# Patient Record
Sex: Female | Born: 1935 | Race: White | Hispanic: No | Smoking: Never smoker
Health system: Southern US, Community
[De-identification: ages and names within clinical notes are randomized; demographics above are authoritative.]

## PROBLEM LIST (undated history)

## (undated) DIAGNOSIS — S72009A Fracture of unspecified part of neck of unspecified femur, initial encounter for closed fracture: Secondary | ICD-10-CM

## (undated) DIAGNOSIS — Z9181 History of falling: Secondary | ICD-10-CM

## (undated) DIAGNOSIS — F32A Depression, unspecified: Secondary | ICD-10-CM

## (undated) DIAGNOSIS — C801 Malignant (primary) neoplasm, unspecified: Secondary | ICD-10-CM

## (undated) DIAGNOSIS — F411 Generalized anxiety disorder: Secondary | ICD-10-CM

## (undated) DIAGNOSIS — H3530 Unspecified macular degeneration: Secondary | ICD-10-CM

## (undated) DIAGNOSIS — K5900 Constipation, unspecified: Secondary | ICD-10-CM

## (undated) DIAGNOSIS — M1990 Unspecified osteoarthritis, unspecified site: Secondary | ICD-10-CM

## (undated) DIAGNOSIS — S72009D Fracture of unspecified part of neck of unspecified femur, subsequent encounter for closed fracture with routine healing: Secondary | ICD-10-CM

## (undated) DIAGNOSIS — R269 Unspecified abnormalities of gait and mobility: Secondary | ICD-10-CM

## (undated) DIAGNOSIS — Z7901 Long term (current) use of anticoagulants: Secondary | ICD-10-CM

## (undated) DIAGNOSIS — D649 Anemia, unspecified: Secondary | ICD-10-CM

## (undated) DIAGNOSIS — M810 Age-related osteoporosis without current pathological fracture: Secondary | ICD-10-CM

## (undated) DIAGNOSIS — I10 Essential (primary) hypertension: Secondary | ICD-10-CM

## (undated) DIAGNOSIS — I999 Unspecified disorder of circulatory system: Secondary | ICD-10-CM

## (undated) DIAGNOSIS — F0390 Unspecified dementia without behavioral disturbance: Secondary | ICD-10-CM

## (undated) DIAGNOSIS — F329 Major depressive disorder, single episode, unspecified: Secondary | ICD-10-CM

## (undated) DIAGNOSIS — N189 Chronic kidney disease, unspecified: Secondary | ICD-10-CM

## (undated) HISTORY — DX: History of falling: Z91.81

## (undated) HISTORY — DX: Generalized anxiety disorder: F41.1

## (undated) HISTORY — DX: Anemia, unspecified: D64.9

## (undated) HISTORY — PX: OTHER SURGICAL HISTORY: SHX169

## (undated) HISTORY — DX: Depression, unspecified: F32.A

## (undated) HISTORY — DX: Age-related osteoporosis without current pathological fracture: M81.0

## (undated) HISTORY — DX: Fracture of unspecified part of neck of unspecified femur, subsequent encounter for closed fracture with routine healing: S72.009D

## (undated) HISTORY — DX: Fracture of unspecified part of neck of unspecified femur, initial encounter for closed fracture: S72.009A

## (undated) HISTORY — DX: Unspecified abnormalities of gait and mobility: R26.9

## (undated) HISTORY — DX: Major depressive disorder, single episode, unspecified: F32.9

## (undated) HISTORY — DX: Unspecified disorder of circulatory system: I99.9

## (undated) HISTORY — DX: Long term (current) use of anticoagulants: Z79.01

## (undated) HISTORY — DX: Constipation, unspecified: K59.00

## (undated) HISTORY — PX: ABDOMINAL HYSTERECTOMY: SHX81

---

## 1998-06-07 ENCOUNTER — Ambulatory Visit (HOSPITAL_COMMUNITY): Admission: RE | Admit: 1998-06-07 | Discharge: 1998-06-07 | Payer: Self-pay | Admitting: Obstetrics and Gynecology

## 1998-06-08 ENCOUNTER — Other Ambulatory Visit: Admission: RE | Admit: 1998-06-08 | Discharge: 1998-06-08 | Payer: Self-pay | Admitting: Obstetrics and Gynecology

## 1998-06-16 ENCOUNTER — Ambulatory Visit (HOSPITAL_COMMUNITY): Admission: RE | Admit: 1998-06-16 | Discharge: 1998-06-16 | Payer: Self-pay | Admitting: Obstetrics and Gynecology

## 2000-03-17 ENCOUNTER — Encounter: Admission: RE | Admit: 2000-03-17 | Discharge: 2000-03-17 | Payer: Self-pay | Admitting: Internal Medicine

## 2000-03-17 ENCOUNTER — Encounter: Payer: Self-pay | Admitting: Internal Medicine

## 2002-07-26 ENCOUNTER — Other Ambulatory Visit: Admission: RE | Admit: 2002-07-26 | Discharge: 2002-07-26 | Payer: Self-pay | Admitting: Obstetrics and Gynecology

## 2002-08-02 ENCOUNTER — Encounter: Payer: Self-pay | Admitting: Internal Medicine

## 2002-08-02 ENCOUNTER — Encounter (INDEPENDENT_AMBULATORY_CARE_PROVIDER_SITE_OTHER): Payer: Self-pay | Admitting: *Deleted

## 2002-08-02 ENCOUNTER — Other Ambulatory Visit: Admission: RE | Admit: 2002-08-02 | Discharge: 2002-08-02 | Payer: Self-pay | Admitting: Diagnostic Radiology

## 2002-08-02 ENCOUNTER — Encounter: Admission: RE | Admit: 2002-08-02 | Discharge: 2002-08-02 | Payer: Self-pay | Admitting: Internal Medicine

## 2002-09-06 ENCOUNTER — Encounter: Payer: Self-pay | Admitting: Surgery

## 2002-09-07 ENCOUNTER — Encounter (INDEPENDENT_AMBULATORY_CARE_PROVIDER_SITE_OTHER): Payer: Self-pay | Admitting: *Deleted

## 2002-09-07 ENCOUNTER — Ambulatory Visit (HOSPITAL_COMMUNITY): Admission: RE | Admit: 2002-09-07 | Discharge: 2002-09-08 | Payer: Self-pay | Admitting: Surgery

## 2002-09-07 ENCOUNTER — Encounter: Payer: Self-pay | Admitting: Surgery

## 2002-10-06 ENCOUNTER — Ambulatory Visit: Admission: RE | Admit: 2002-10-06 | Discharge: 2002-12-28 | Payer: Self-pay | Admitting: Family Medicine

## 2002-10-19 ENCOUNTER — Encounter: Admission: RE | Admit: 2002-10-19 | Discharge: 2002-10-19 | Payer: Self-pay | Admitting: Radiation Oncology

## 2003-05-11 ENCOUNTER — Encounter: Admission: RE | Admit: 2003-05-11 | Discharge: 2003-05-11 | Payer: Self-pay | Admitting: Surgery

## 2003-05-11 ENCOUNTER — Encounter: Payer: Self-pay | Admitting: Surgery

## 2004-05-11 ENCOUNTER — Encounter: Admission: RE | Admit: 2004-05-11 | Discharge: 2004-05-11 | Payer: Self-pay | Admitting: Surgery

## 2004-08-27 ENCOUNTER — Ambulatory Visit: Payer: Self-pay | Admitting: Oncology

## 2004-12-21 ENCOUNTER — Ambulatory Visit: Payer: Self-pay | Admitting: Oncology

## 2004-12-26 ENCOUNTER — Encounter: Admission: RE | Admit: 2004-12-26 | Discharge: 2004-12-26 | Payer: Self-pay | Admitting: Oncology

## 2005-02-07 ENCOUNTER — Ambulatory Visit: Payer: Self-pay | Admitting: Oncology

## 2005-05-06 ENCOUNTER — Ambulatory Visit: Payer: Self-pay | Admitting: Oncology

## 2005-11-04 ENCOUNTER — Ambulatory Visit: Payer: Self-pay | Admitting: Oncology

## 2006-05-02 ENCOUNTER — Ambulatory Visit: Payer: Self-pay | Admitting: Oncology

## 2006-05-19 LAB — CBC WITH DIFFERENTIAL (CANCER CENTER ONLY)
EOS%: 2.3 % (ref 0.0–7.0)
LYMPH#: 0.9 10*3/uL (ref 0.9–3.3)
NEUT#: 2.4 10*3/uL (ref 1.5–6.5)
NEUT%: 67 % (ref 39.6–80.0)
Platelets: 200 10*3/uL (ref 145–400)
RDW: 12.3 % (ref 10.5–14.6)
WBC: 3.6 10*3/uL — ABNORMAL LOW (ref 3.9–10.0)

## 2006-05-19 LAB — COMPREHENSIVE METABOLIC PANEL
AST: 16 U/L (ref 0–37)
Albumin: 3.7 g/dL (ref 3.5–5.2)
Alkaline Phosphatase: 34 U/L — ABNORMAL LOW (ref 39–117)
Chloride: 105 mEq/L (ref 96–112)
Creatinine, Ser: 1.4 mg/dL — ABNORMAL HIGH (ref 0.40–1.20)
Potassium: 4.7 mEq/L (ref 3.5–5.3)
Total Bilirubin: 0.6 mg/dL (ref 0.3–1.2)
Total Protein: 6.1 g/dL (ref 6.0–8.3)

## 2006-05-27 ENCOUNTER — Encounter: Admission: RE | Admit: 2006-05-27 | Discharge: 2006-05-27 | Payer: Self-pay | Admitting: Oncology

## 2006-11-13 ENCOUNTER — Ambulatory Visit: Payer: Self-pay | Admitting: Oncology

## 2006-11-17 LAB — CBC WITH DIFFERENTIAL (CANCER CENTER ONLY)
BASO#: 0 10*3/uL (ref 0.0–0.2)
BASO%: 0.3 % (ref 0.0–2.0)
EOS%: 2 % (ref 0.0–7.0)
Eosinophils Absolute: 0.1 10*3/uL (ref 0.0–0.5)
LYMPH#: 1 10*3/uL (ref 0.9–3.3)
LYMPH%: 28.2 % (ref 14.0–48.0)
MCH: 31.4 pg (ref 26.0–34.0)
MCV: 90 fL (ref 81–101)
MONO#: 0.3 10*3/uL (ref 0.1–0.9)
NEUT#: 2.2 10*3/uL (ref 1.5–6.5)
Platelets: 191 10*3/uL (ref 145–400)
WBC: 3.5 10*3/uL — ABNORMAL LOW (ref 3.9–10.0)

## 2006-11-17 LAB — COMPREHENSIVE METABOLIC PANEL
AST: 15 U/L (ref 0–37)
Albumin: 3.8 g/dL (ref 3.5–5.2)
Calcium: 8.6 mg/dL (ref 8.4–10.5)
Chloride: 105 mEq/L (ref 96–112)
Glucose, Bld: 108 mg/dL — ABNORMAL HIGH (ref 70–99)
Potassium: 4.4 mEq/L (ref 3.5–5.3)
Sodium: 140 mEq/L (ref 135–145)

## 2006-11-17 LAB — CANCER ANTIGEN 27.29: CA 27.29: 33 U/mL (ref 0–39)

## 2007-05-15 ENCOUNTER — Ambulatory Visit: Payer: Self-pay | Admitting: Oncology

## 2007-05-18 LAB — LACTATE DEHYDROGENASE: LDH: 155 U/L (ref 94–250)

## 2007-05-18 LAB — COMPREHENSIVE METABOLIC PANEL
ALT: <8 U/L (ref 0–35)
Alkaline Phosphatase: 47 U/L (ref 39–117)
BUN: 16 mg/dL (ref 6–23)
Glucose, Bld: 114 mg/dL — ABNORMAL HIGH (ref 70–99)
Sodium: 139 mEq/L (ref 135–145)
Total Bilirubin: 0.5 mg/dL (ref 0.3–1.2)
Total Protein: 6.5 g/dL (ref 6.0–8.3)

## 2007-05-18 LAB — CBC WITH DIFFERENTIAL (CANCER CENTER ONLY)
BASO#: 0 10*3/uL (ref 0.0–0.2)
EOS%: 2.9 % (ref 0.0–7.0)
HCT: 37.4 % (ref 34.8–46.6)
HGB: 12.7 g/dL (ref 11.6–15.9)
LYMPH#: 1 10*3/uL (ref 0.9–3.3)
MCH: 30.3 pg (ref 26.0–34.0)
MCHC: 33.9 g/dL (ref 32.0–36.0)
MONO#: 0.3 10*3/uL (ref 0.1–0.9)
MONO%: 6.7 % (ref 0.0–13.0)
NEUT#: 3 10*3/uL (ref 1.5–6.5)
NEUT%: 68.1 % (ref 39.6–80.0)
Platelets: 228 10*3/uL (ref 145–400)

## 2007-11-05 ENCOUNTER — Encounter: Admission: RE | Admit: 2007-11-05 | Discharge: 2007-11-05 | Payer: Self-pay | Admitting: Oncology

## 2007-11-12 ENCOUNTER — Ambulatory Visit: Payer: Self-pay | Admitting: Oncology

## 2008-03-01 ENCOUNTER — Ambulatory Visit: Payer: Self-pay | Admitting: Oncology

## 2008-03-02 LAB — CBC WITH DIFFERENTIAL (CANCER CENTER ONLY)
BASO#: 0 10*3/uL (ref 0.0–0.2)
BASO%: 0.3 % (ref 0.0–2.0)
EOS%: 1.8 % (ref 0.0–7.0)
HCT: 35.6 % (ref 34.8–46.6)
HGB: 12.7 g/dL (ref 11.6–15.9)
LYMPH#: 1.3 10*3/uL (ref 0.9–3.3)
LYMPH%: 33.8 % (ref 14.0–48.0)
MONO#: 0.3 10*3/uL (ref 0.1–0.9)
MONO%: 7.4 % (ref 0.0–13.0)
NEUT#: 2.1 10*3/uL (ref 1.5–6.5)
NEUT%: 56.7 % (ref 39.6–80.0)
RBC: 4.05 10*6/uL (ref 3.70–5.32)
RDW: 12.4 % (ref 10.5–14.6)

## 2008-03-02 LAB — COMPREHENSIVE METABOLIC PANEL
ALT: 8 U/L (ref 0–35)
AST: 20 U/L (ref 0–37)
Albumin: 3.9 g/dL (ref 3.5–5.2)
Calcium: 9.2 mg/dL (ref 8.4–10.5)
Chloride: 105 mEq/L (ref 96–112)
Sodium: 141 mEq/L (ref 135–145)
Total Protein: 6.2 g/dL (ref 6.0–8.3)

## 2008-03-02 LAB — LACTATE DEHYDROGENASE: LDH: 201 U/L (ref 94–250)

## 2008-05-23 ENCOUNTER — Ambulatory Visit: Payer: Self-pay | Admitting: Oncology

## 2008-05-25 LAB — CBC WITH DIFFERENTIAL (CANCER CENTER ONLY)
BASO#: 0 10*3/uL (ref 0.0–0.2)
HGB: 12.4 g/dL (ref 11.6–15.9)
LYMPH#: 1.1 10*3/uL (ref 0.9–3.3)
MCH: 31.3 pg (ref 26.0–34.0)
MONO%: 6 % (ref 0.0–13.0)
NEUT%: 64.7 % (ref 39.6–80.0)
WBC: 4 10*3/uL (ref 3.9–10.0)

## 2008-05-26 LAB — COMPREHENSIVE METABOLIC PANEL
ALT: <8 U/L (ref 0–35)
Albumin: 3.7 g/dL (ref 3.5–5.2)
CO2: 24 mEq/L (ref 19–32)
Calcium: 9.2 mg/dL (ref 8.4–10.5)
Chloride: 101 mEq/L (ref 96–112)
Potassium: 4.5 mEq/L (ref 3.5–5.3)
Sodium: 137 mEq/L (ref 135–145)

## 2008-05-26 LAB — CANCER ANTIGEN 27.29: CA 27.29: 39 U/mL (ref 0–39)

## 2009-05-22 ENCOUNTER — Ambulatory Visit: Payer: Self-pay | Admitting: Oncology

## 2010-09-30 ENCOUNTER — Encounter: Payer: Self-pay | Admitting: Oncology

## 2010-10-01 ENCOUNTER — Encounter: Payer: Self-pay | Admitting: Oncology

## 2011-01-07 ENCOUNTER — Other Ambulatory Visit: Payer: Self-pay | Admitting: Internal Medicine

## 2011-01-07 DIAGNOSIS — R0989 Other specified symptoms and signs involving the circulatory and respiratory systems: Secondary | ICD-10-CM

## 2011-01-25 NOTE — Op Note (Signed)
NAME:  Morgan [name], Morgan [name]                        ACCOUNT NO.:  [id number]   MEDICAL RECORD NO.:  [id number]                   PATIENT TYPE:  OIB   LOCATION:  2550                                 FACILITY:  [facility]   PHYSICIAN:  Velora Heckler, M.D.                DATE OF BIRTH:  1927-05-26   DATE OF PROCEDURE:  09/07/2002  DATE OF DISCHARGE:                                 OPERATIVE REPORT   PREOPERATIVE DIAGNOSIS:  Left breast carcinoma.   POSTOPERATIVE DIAGNOSIS:  Left breast carcinoma.   PROCEDURES:  1. Left partial mastectomy.  2. Left axillary sentinel lymph node biopsy.  3. Blue dye injection.   SURGEON:  Velora Heckler, M.D.   ANESTHESIA:  General.   ESTIMATED BLOOD LOSS:  Minimal.   PREPARATION:  Betadine.   COMPLICATIONS:  None.   INDICATIONS:  The patient is a 77 year old white female recently diagnosed  by core needle biopsy with left breast invasive mammary carcinoma.  The  patient now comes to surgery for partial mastectomy and sentinel lymph node  biopsy for treatment of left breast cancer.   DESCRIPTION OF PROCEDURE:  The procedure was done in OR #[id number] at the [facility]  The patient is brought to the operating room,  placed in the supine position on the operating room table.  Following  administration of general anesthesia, the patient is positioned and then  prepped and draped in the usual strict aseptic fashion.  The left axilla is  then scanned using the Neoprobe.  An area of increased radioactivity is  marked on the skin in the low axilla.  This likely represents sentinel lymph  node.  Using 5 cc of Lymphazurin blue dye, an injection is made in a  subareolar location in the upper outer quadrant of the left breast.  This is  then massaged for five minutes.   Next incision is made at the site of localization in the left axilla.  Dissection is carried down through the subcutaneous tissues and hemostasis  obtained via electrocautery.   Using the Neoprobe for guidance, two blue  lymph nodes, which were both radioactive, are identified.  These are  dissected out.  Major vascular structures and lymphatics are divided between  medium Ligaclips.  Both nodes are excised.  Ex vivo they are radioactive.  Both nodes are blue.  They are submitted labeled, sentinel lymph node #1  and sentinel lymph node #2.  Residual axillary tissue involved in the  dissection is also submitted separately.  The axilla is then again scanned  with the Neoprobe, with no increased radioactivity identified.  Good  hemostasis is noted.  A pack is placed in the wound.  Next an elliptical  incision is made in the left upper outer quadrant of the breast overlying  the palpable mass.  Skin flaps are raised circumferentially using skin  hooks.  Dissection is then carried  down to the chest wall.  Care is taken to  preserve at least a 2-3 cm margin around the palpable tumor.  The partial  mastectomy specimen is then reflected off the underlying pectoralis major  muscle.  The specimen is completely excised using the electrocautery for  hemostasis.  Sutures are then used to orient the specimen as to the  superior, lateral, and deep margins.  It is then submitted fresh to  pathology, where touch preps demonstrate no evidence of tumor at the  margins.  Both sentinel lymph nodes were also negative for tumor by touch  prep technique.  Good hemostasis is obtained.  Margins of dissection are  marked with medium Ligaclips.  Subcutaneous tissues are reapproximated with  interrupted 3-0 Vicryl sutures.  Skin edges are closed with running 4-0  Vicryl subcuticular suture.  Axillary wound is closed with subcutaneous 3-0  Vicryl sutures.  Skin edges are reapproximated with a running 4-0 Vicryl  subcuticular suture.  Wounds are washed and dried and Benzoin and Steri-  Strips are applied to both wounds.  Sterile dressings are applied to both  wounds.  The patient is awakened  from anesthesia and brought to the recovery  room in stable condition.  The patient tolerated the procedure well.                                               Velora Heckler, M.D.    TMG/MEDQ  D:  09/07/2002  T:  09/07/2002  Job:  [id number]   cc:   Duke Salvia. Marcelle Overlie, M.D.  [address]  [place]  [place] [address]  Fax: [phone]   Larina Earthly, M.D.  [address]  [place]  [place] [address]  Fax: [phone]

## 2011-02-15 ENCOUNTER — Ambulatory Visit
Admission: RE | Admit: 2011-02-15 | Discharge: 2011-02-15 | Disposition: A | Discharge status: Home / Self Care | Payer: Medicare Other | Source: Ambulatory Visit | Attending: Internal Medicine | Admitting: Internal Medicine

## 2011-02-15 DIAGNOSIS — R0989 Other specified symptoms and signs involving the circulatory and respiratory systems: Secondary | ICD-10-CM

## 2011-02-19 ENCOUNTER — Other Ambulatory Visit: Payer: Self-pay | Admitting: Internal Medicine

## 2011-02-19 DIAGNOSIS — E041 Nontoxic single thyroid nodule: Secondary | ICD-10-CM

## 2011-03-08 ENCOUNTER — Ambulatory Visit
Admission: RE | Admit: 2011-03-08 | Discharge: 2011-03-08 | Disposition: A | Discharge status: Home / Self Care | Payer: Medicare Other | Source: Ambulatory Visit | Attending: Internal Medicine | Admitting: Internal Medicine

## 2011-03-08 DIAGNOSIS — E041 Nontoxic single thyroid nodule: Secondary | ICD-10-CM

## 2012-07-10 HISTORY — PX: FRACTURE SURGERY: SHX138

## 2012-07-31 ENCOUNTER — Encounter (HOSPITAL_COMMUNITY): Payer: Self-pay | Admitting: Emergency Medicine

## 2012-07-31 ENCOUNTER — Emergency Department (HOSPITAL_COMMUNITY): Payer: Medicare Other

## 2012-07-31 ENCOUNTER — Inpatient Hospital Stay: Admit: 2012-07-31 | Payer: Self-pay | Admitting: Orthopedic Surgery

## 2012-07-31 ENCOUNTER — Inpatient Hospital Stay (HOSPITAL_COMMUNITY)
Admission: EM | Admit: 2012-07-31 | Discharge: 2012-08-05 | DRG: 470 | Disposition: A | Discharge status: Skilled Nursing Facility | Payer: Medicare Other | Attending: Internal Medicine | Admitting: Internal Medicine

## 2012-07-31 DIAGNOSIS — S72009A Fracture of unspecified part of neck of unspecified femur, initial encounter for closed fracture: Secondary | ICD-10-CM

## 2012-07-31 DIAGNOSIS — D62 Acute posthemorrhagic anemia: Secondary | ICD-10-CM | POA: Diagnosis present

## 2012-07-31 DIAGNOSIS — F05 Delirium due to known physiological condition: Secondary | ICD-10-CM

## 2012-07-31 DIAGNOSIS — I10 Essential (primary) hypertension: Secondary | ICD-10-CM | POA: Diagnosis present

## 2012-07-31 DIAGNOSIS — S72002A Fracture of unspecified part of neck of left femur, initial encounter for closed fracture: Secondary | ICD-10-CM | POA: Diagnosis present

## 2012-07-31 DIAGNOSIS — F0390 Unspecified dementia without behavioral disturbance: Secondary | ICD-10-CM | POA: Diagnosis present

## 2012-07-31 DIAGNOSIS — R5082 Postprocedural fever: Secondary | ICD-10-CM | POA: Diagnosis present

## 2012-07-31 DIAGNOSIS — W06XXXA Fall from bed, initial encounter: Secondary | ICD-10-CM | POA: Diagnosis present

## 2012-07-31 DIAGNOSIS — W19XXXA Unspecified fall, initial encounter: Secondary | ICD-10-CM | POA: Diagnosis present

## 2012-07-31 DIAGNOSIS — R339 Retention of urine, unspecified: Secondary | ICD-10-CM

## 2012-07-31 DIAGNOSIS — S51009A Unspecified open wound of unspecified elbow, initial encounter: Secondary | ICD-10-CM | POA: Diagnosis present

## 2012-07-31 DIAGNOSIS — Y92009 Unspecified place in unspecified non-institutional (private) residence as the place of occurrence of the external cause: Secondary | ICD-10-CM

## 2012-07-31 DIAGNOSIS — S72033A Displaced midcervical fracture of unspecified femur, initial encounter for closed fracture: Principal | ICD-10-CM | POA: Diagnosis present

## 2012-07-31 HISTORY — DX: Unspecified dementia, unspecified severity, without behavioral disturbance, psychotic disturbance, mood disturbance, and anxiety: F03.90

## 2012-07-31 HISTORY — DX: Unspecified osteoarthritis, unspecified site: M19.90

## 2012-07-31 HISTORY — DX: Unspecified macular degeneration: H35.30

## 2012-07-31 HISTORY — DX: Essential (primary) hypertension: I10

## 2012-07-31 HISTORY — DX: Chronic kidney disease, unspecified: N18.9

## 2012-07-31 HISTORY — DX: Malignant (primary) neoplasm, unspecified: C80.1

## 2012-07-31 LAB — BASIC METABOLIC PANEL
CO2: 28 mEq/L (ref 19–32)
Chloride: 102 mEq/L (ref 96–112)
GFR calc Af Amer: 90 mL/min — ABNORMAL LOW (ref >90)
GFR calc non Af Amer: 77 mL/min — ABNORMAL LOW (ref >90)
Potassium: 4.2 mEq/L (ref 3.5–5.1)

## 2012-07-31 LAB — URINALYSIS, ROUTINE W REFLEX MICROSCOPIC
Glucose, UA: NEGATIVE mg/dL
Hgb urine dipstick: NEGATIVE
Ketones, ur: 15 mg/dL — AB
Leukocytes, UA: NEGATIVE
Protein, ur: NEGATIVE mg/dL
Specific Gravity, Urine: 1.016 (ref 1.005–1.030)
pH: 6 (ref 5.0–8.0)

## 2012-07-31 LAB — CBC WITH DIFFERENTIAL/PLATELET
Basophils Relative: 0 % (ref 0–1)
Eosinophils Relative: 0 % (ref 0–5)
Hemoglobin: 13.5 g/dL (ref 12.0–15.0)
Lymphs Abs: 0.4 10*3/uL — ABNORMAL LOW (ref 0.7–4.0)
MCHC: 34.2 g/dL (ref 30.0–36.0)
Monocytes Relative: 4 % (ref 3–12)
RBC: 4.44 MIL/uL (ref 3.87–5.11)
RDW: 12.7 % (ref 11.5–15.5)

## 2012-07-31 LAB — PROTIME-INR
INR: 0.99 (ref 0.00–1.49)
Prothrombin Time: 13 seconds (ref 11.6–15.2)

## 2012-07-31 MED ORDER — LORAZEPAM 2 MG/ML IJ SOLN
1.0000 mg | INTRAMUSCULAR | Status: AC
  Administered 2012-07-31: 1 mg via INTRAVENOUS
  Filled 2012-07-31: qty 1

## 2012-07-31 MED ORDER — MORPHINE SULFATE 2 MG/ML IJ SOLN: 0.5000 mg | INTRAMUSCULAR | Status: DC | PRN

## 2012-07-31 MED ORDER — HYDROCODONE-ACETAMINOPHEN 5-325 MG PO TABS: 1.0000 | ORAL_TABLET | ORAL | Status: AC | PRN

## 2012-07-31 MED ORDER — MORPHINE SULFATE 4 MG/ML IJ SOLN
4.0000 mg | Freq: Once | INTRAMUSCULAR | Status: AC
  Administered 2012-07-31: 4 mg via INTRAVENOUS

## 2012-07-31 MED ORDER — CEFAZOLIN SODIUM-DEXTROSE 2-3 GM-% IV SOLR
2.0000 g | INTRAVENOUS | Status: AC
  Administered 2012-08-01: 2 g via INTRAVENOUS

## 2012-07-31 MED ORDER — ONDANSETRON HCL 4 MG/2ML IJ SOLN: 4.0000 mg | Freq: Three times a day (TID) | INTRAMUSCULAR | Status: AC | PRN

## 2012-07-31 MED ORDER — HYDROCODONE-ACETAMINOPHEN 5-325 MG PO TABS
1.0000 | ORAL_TABLET | Freq: Four times a day (QID) | ORAL | Status: DC | PRN
  Administered 2012-08-02 – 2012-08-03 (×2): 2 via ORAL
  Administered 2012-08-05: 1 via ORAL
  Filled 2012-07-31: qty 2
  Filled 2012-07-31 (×3): qty 1

## 2012-07-31 MED ORDER — MORPHINE SULFATE 4 MG/ML IJ SOLN
INTRAMUSCULAR | Status: AC
  Administered 2012-07-31: 4 mg via INTRAVENOUS
  Filled 2012-07-31: qty 1

## 2012-07-31 MED ORDER — LORAZEPAM 2 MG/ML IJ SOLN
1.0000 mg | Freq: Four times a day (QID) | INTRAMUSCULAR | Status: DC | PRN
  Administered 2012-07-31 – 2012-08-02 (×3): 1 mg via INTRAVENOUS
  Filled 2012-07-31 (×3): qty 1

## 2012-07-31 MED ORDER — MORPHINE SULFATE 2 MG/ML IJ SOLN
0.5000 mg | INTRAMUSCULAR | Status: AC | PRN
  Administered 2012-08-01: 0.5 mg via INTRAVENOUS
  Filled 2012-07-31: qty 1

## 2012-07-31 MED ORDER — METOPROLOL TARTRATE 1 MG/ML IV SOLN
5.0000 mg | Freq: Once | INTRAVENOUS | Status: AC
  Administered 2012-07-31: 5 mg via INTRAVENOUS
  Filled 2012-07-31: qty 5

## 2012-07-31 MED ORDER — POTASSIUM CHLORIDE IN NACL 20-0.45 MEQ/L-% IV SOLN
INTRAVENOUS | Status: DC
  Administered 2012-07-31 – 2012-08-01 (×2): via INTRAVENOUS
  Filled 2012-07-31 (×8): qty 1000

## 2012-07-31 MED ORDER — HYDRALAZINE HCL 20 MG/ML IJ SOLN
5.0000 mg | Freq: Four times a day (QID) | INTRAMUSCULAR | Status: DC | PRN
  Administered 2012-07-31: 5 mg via INTRAVENOUS
  Filled 2012-07-31: qty 0.25

## 2012-07-31 MED ORDER — METOPROLOL TARTRATE 1 MG/ML IV SOLN
5.0000 mg | Freq: Four times a day (QID) | INTRAVENOUS | Status: DC | PRN
  Filled 2012-07-31: qty 5

## 2012-07-31 NOTE — ED Notes (Addendum)
Per patient's daughter, she was standing to go to the bathroom and fell. Pt uses a walker at home. Pt lives alone with home health care. Pt has hx of dementia and daughter states she is POA. Daughter currently at bedside.

## 2012-07-31 NOTE — H&P (Signed)
[facility] History and Physical  DAPHYNE MIGUEZ QMV:[id number] DOB: July 11, 1927 DOA: 07/31/2012  Referring physician: Gwyneth Sprout, ER physician PCP: Georgianne Fick, MD  Specialists: [facility], orthopedic surgery  Chief Complaint: Fall  HPI: Morgan [name] is a 78 y.o. female  Past medical history of hypertension and advanced dementia who fell out of bed on witnessed today in her left. Her daughter lives close by and visit soft and found the patient early this morning. She is brought into the emergency room where she was found by films to have a left-sided hip fracture. Patient's blood pressure was also noted markedly elevated, which is unusual for her. She received IV Lopressor for her blood pressure. She remains still somewhat quite agitated. She denies that a fall or hip fracture ever happened. Her daughter states that she does get increased confusion when she is in the hospital. She was evaluated by orthopedic surgery and hip fracture repair is recommended. Hospitalists were called for further evaluation and admission.  Review of Systems: Difficult to get a review of systems given her advanced dementia plus acute delirium. The patient states that she's not having any chest pain or shortness of breath. She also denies that she ever had a fall or hip fracture and denies any pain.   Past Medical History  Diagnosis Date  . Hypertension    Past Surgical History  Procedure Date  . Lumpectormy    Social History:  reports that she has never smoked. She has never used smokeless tobacco. She reports that she does not drink alcohol or use illicit drugs. Patient she lives at home alone, daughter has arranged for a nurse provider to come by in the afternoons. Patient normally is able to ambulate using a walker and participates in some activities of daily living  Allergies  Allergen Reactions  . Codeine Nausea And Vomiting    Family history: Reviewed with daughter, history of  hypertension and that's about it  Prior to Admission medications   Medication Sig Start Date End Date Taking? Authorizing Provider  amLODipine (NORVASC) 5 MG tablet Take 5 mg by mouth daily.   Yes Historical Provider, MD   Physical Exam: Filed Vitals:   07/31/12 1543 07/31/12 1545 07/31/12 1715 07/31/12 1800  BP: 178/90 178/90 206/90 190/73  Pulse: 99 100 115   Temp: 97.1 F (36.2 C)     Resp: 18 22 20 15   SpO2: 100% 99% 94%      General:  Alert and oriented x1, mildly agitated, but otherwise no distress. No pain complaint  Eyes: Sclera nonicteric, extraocular movements appear intact  ENT: Normocephalic, atraumatic, patient has signs of actinic keratoses and basal cell carcinoma on her 4 head and left side of her nose  Neck: Supple, no JVD  Cardiovascular: Regular rate and rhythm, S1-S2  Respiratory: Clear to auscultation bilaterally  Abdomen: Soft, nontender, nondistended, positive bowel sounds  Skin: Patient has a few multiple superficial skin tears. 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  Musculoskeletal: No clubbing or cyanosis or edema, further muscular skeletal exam deferred because of fracture  Psychiatric: Patient has chronic advanced dementia with some acute delirium  Neurologic: Difficult to say, but no overt evidence of neurological deficit  Labs on Admission:  Basic Metabolic Panel:  Lab 07/31/12 2841  NA 140  K 4.2  CL 102  CO2 28  GLUCOSE 135*  BUN 16  CREATININE 0.72  CALCIUM 9.5  MG --  PHOS --   CBC:  Lab 07/31/12 1639  WBC 10.7*  NEUTROABS 9.9*  HGB 13.5  HCT 39.5  MCV 89.0  PLT 227   Radiological Exams on Admission: Dg Chest 2 View  07/31/2012    IMPRESSION: Hyperexpanded lungs without acute cardiopulmonary disease.   Original Report Authenticated By: Tacey Ruiz, MD    Dg Hip Complete Left  07/31/2012   IMPRESSION: Displaced subcapsular fracture of the left femoral neck with associated  foreshortening and varus angulation.   Original Report Authenticated By: Tacey Ruiz, MD     EKG: Independently reviewed. Essentially normal sinus rhythm plus sinus tachycardia  Assessment/Plan Principal Problem:  *Hip fracture, left: Will go to the OR tonight. Orthopedics for hip repair Active Problems:  Senile dementia: Patient having some acute delirium and agitation. We'll try one dose of Ativan to night plus when necessary Ativan. Daughter confirms whenever patient is to the hospital or medical office, she does get acutely agitated and acutely more confused  HTN (hypertension), malignant: Normally on Norvasc. Daughter states hypertension we will control. Likely increased secondary to agitation from confusion plus pain  Fall    Code Status: Full code as confirmed by daughter who is power of attorney  Family Communication: Discussed plan with daughter who is at the bedside  Disposition Plan: Likely will be here until Monday for skilled nursing facility  Time spent: 30 minutes  Hollice Espy [facility] Pager [phone]  If 7PM-7AM, please contact night-coverage [email or web address] Password [facility] 07/31/2012, 6:24 PM

## 2012-07-31 NOTE — ED Notes (Signed)
MD at bedside. 

## 2012-07-31 NOTE — ED Notes (Signed)
Per EMS pt came from home where she fell from standing. EMS gave 5mg  morphine IV en route. Pt is alert and oriented but get confused at times. Left hip is externally rotated and shortened. No chest pain, no cardiac hx. No hx of hip replacement.

## 2012-07-31 NOTE — Consult Note (Signed)
Reason for Consult: Evaluate left hip fracture Referring Physician: Dr. Larey Seat is an 78 y.o. female.  HPI: 78 year old female who fell getting out of bed this morning. She landed on her left hip immediate complaint of left hip pain and inability to bear weight. She was taken to the emergency department she was diagnosed with a left displaced femoral neck fracture. I was consult to for evaluation and management. Left hip pain is deep, severe, worse with movement, better with rest. She has mild dementia and lives alone with family nearby. There is an aide who comes in daily to help her. She normally ambulates with the assistance of a walker.  Past Medical History  Diagnosis Date  . Hypertension     Past Surgical History  Procedure Date  . Lumpectormy     No family history on file.  Social History:  reports that she has never smoked. She has never used smokeless tobacco. She reports that she does not drink alcohol or use illicit drugs.  Allergies:  Allergies  Allergen Reactions  . Codeine Nausea And Vomiting    Medications: Prior to Admission:  (Not in a hospital admission)  Results for orders placed during the hospital encounter of 07/31/12 (from the past 48 hour(s))  CBC WITH DIFFERENTIAL     Status: Abnormal   Collection Time   07/31/12  4:39 PM      Component Value Range Comment   WBC 10.7 (*) 4.0 - 10.5 K/uL    RBC 4.44  3.87 - 5.11 MIL/uL    Hemoglobin 13.5  12.0 - 15.0 g/dL    HCT 40.9  81.1 - 91.4 %    MCV 89.0  78.0 - 100.0 fL    MCH 30.4  26.0 - 34.0 pg    MCHC 34.2  30.0 - 36.0 g/dL    RDW 78.2  95.6 - 21.3 %    Platelets 227  150 - 400 K/uL    Neutrophils Relative 92 (*) 43 - 77 %    Neutro Abs 9.9 (*) 1.7 - 7.7 K/uL    Lymphocytes Relative 4 (*) 12 - 46 %    Lymphs Abs 0.4 (*) 0.7 - 4.0 K/uL    Monocytes Relative 4  3 - 12 %    Monocytes Absolute 0.4  0.1 - 1.0 K/uL    Eosinophils Relative 0  0 - 5 %    Eosinophils Absolute 0.0  0.0 -  0.7 K/uL    Basophils Relative 0  0 - 1 %    Basophils Absolute 0.0  0.0 - 0.1 K/uL     Dg Chest 2 View  07/31/2012  *RADIOLOGY REPORT*  Clinical Data: Surgical clearance, post fall  CHEST - 2 VIEW  Comparison: None.  Findings:  Borderline enlarged cardiac silhouette and mediastinal contours with extensive atherosclerotic calcifications within the thoracic aorta.  The lungs are mildly hyperexpanded with mild diffuse thickening of pulmonary interstitium.  No focal airspace opacities. No pleural effusion or pneumothorax.  Multiple surgical clips overlie the lateral aspect of the left breast.  There is extensive degenerative change of the bilateral glenohumeral joints.  IMPRESSION: Hyperexpanded lungs without acute cardiopulmonary disease.   Original Report Authenticated By: Tacey Ruiz, MD    Dg Hip Complete Left  07/31/2012  *RADIOLOGY REPORT*  Clinical Data: Post fall, now with left hip pain  LEFT HIP - COMPLETE 2+ VIEW  Comparison: None.  Findings:  There is a subcapital fracture of the left  femoral neck with associated foreshortening and varus angulation of the fracture fragments.  There is no definite extension of the fracture into the femoral articular surface.  Expected adjacent soft tissue swelling. No radiopaque foreign body.  No definite associated pelvic fracture.  Multilevel lumbar spine degenerative change. Vascular calcifications.  Multiple phleboliths overlie the pelvis.  IMPRESSION: Displaced subcapsular fracture of the left femoral neck with associated foreshortening and varus angulation.   Original Report Authenticated By: Tacey Ruiz, MD     Review of Systems  Unable to perform ROS: dementia   Blood pressure 206/90, pulse 115, temperature 97.1 F (36.2 C), resp. rate 20, SpO2 94.00%. Physical Exam  Constitutional:       Elderly, frail  HENT:  Head: Normocephalic and atraumatic.  Eyes: EOM are normal.  Cardiovascular: Intact distal pulses.   Respiratory: Effort normal.    Musculoskeletal:       LLE shortened, ER.    Wiggles toes, NSTLT  CR <2sec  TTP L hip  Neurological: She is alert.  Skin: Skin is warm and dry.  Psychiatric: She has a normal mood and affect.    Assessment/Plan: Left displaced femoral neck fracture Recommend left hip hemiarthroplasty for pain control, early ambulation, prevent Complications of bed rest  Discussed with daughter at length including risks benefits alternatives. Plan for surgery when medically stable possibly tonight or tomorrow. N.p.o. for surgery  Morgan [name] 07/31/2012, 5:26 PM

## 2012-07-31 NOTE — ED Provider Notes (Addendum)
History     CSN: [id number]  Arrival date & time 07/31/12  1533   First MD Initiated Contact with Patient 07/31/12 1543      Chief Complaint  Patient presents with  . Hip Pain  . Fall    (Consider location/radiation/quality/duration/timing/severity/associated sxs/prior treatment) Patient is a 78 y.o. female presenting with hip pain and fall. The history is provided by the patient.  Hip Pain This is a new problem. The current episode started 3 to 5 hours ago. The problem occurs constantly. The problem has not changed since onset.Pertinent negatives include no abdominal pain and no headaches. Associated symptoms comments: Larey Seat this am and landed on left hip.  Has been unable to ambulate since. The symptoms are aggravated by walking and bending. The symptoms are relieved by rest. Treatments tried: morphine. The treatment provided significant relief.  Fall The fall occurred while walking. She fell from a height of 1 to 2 ft. She landed on carpet. There was no blood loss. The point of impact was the left hip. The pain is present in the left hip. The pain is at a severity of 4/10. The pain is moderate. She was not ambulatory at the scene. Pertinent negatives include no abdominal pain, no headaches and no loss of consciousness. The symptoms are aggravated by activity.    Past Medical History  Diagnosis Date  . Hypertension     Past Surgical History  Procedure Date  . Lumpectormy     No family history on file.  History  Substance Use Topics  . Smoking status: Never Smoker   . Smokeless tobacco: Never Used  . Alcohol Use: No    OB History    Grav Para Term Preterm Abortions TAB SAB Ect Mult Living                  Review of Systems  Gastrointestinal: Negative for abdominal pain.  Neurological: Negative for loss of consciousness and headaches.  All other systems reviewed and are negative.    Allergies  Review of patient's allergies indicates not on file.  Home  Medications  No current outpatient prescriptions on file.  BP 178/90  Pulse 99  Temp 97.1 F (36.2 C)  Resp 18  SpO2 100%  Physical Exam  Nursing note and vitals reviewed. Constitutional: She is oriented to person, place, and time. She appears well-developed and well-nourished. No distress.  HENT:  Head: Normocephalic and atraumatic.  Mouth/Throat: Oropharynx is clear and moist.  Eyes: Conjunctivae normal and EOM are normal. Pupils are equal, round, and reactive to light.  Neck: Normal range of motion. Neck supple.  Cardiovascular: Normal rate, regular rhythm and intact distal pulses.   No murmur heard. Pulmonary/Chest: Effort normal and breath sounds normal. No respiratory distress. She has no wheezes. She has no rales.  Abdominal: Soft. She exhibits no distension. There is no tenderness. There is no rebound and no guarding.  Musculoskeletal: Normal range of motion. She exhibits no edema and no tenderness.       Left hip: She exhibits tenderness, bony tenderness and deformity.       Cervical back: Normal.       Thoracic back: Normal.       Lumbar back: Normal.       Left lower extremity is externally rotated and shortened  Neurological: She is alert and oriented to person, place, and time.  Skin: Skin is warm and dry. No rash noted. No erythema.  Psychiatric: She has a normal mood  and affect. Her behavior is normal.    ED Course  Procedures (including critical care time)  Labs Reviewed  CBC WITH DIFFERENTIAL - Abnormal; Notable for the following:    WBC 10.7 (*)     Neutrophils Relative 92 (*)     Neutro Abs 9.9 (*)     Lymphocytes Relative 4 (*)     Lymphs Abs 0.4 (*)     All other components within normal limits  BASIC METABOLIC PANEL  URINALYSIS, ROUTINE W REFLEX MICROSCOPIC   Dg Chest 2 View  07/31/2012  *RADIOLOGY REPORT*  Clinical Data: Surgical clearance, post fall  CHEST - 2 VIEW  Comparison: None.  Findings:  Borderline enlarged cardiac silhouette and  mediastinal contours with extensive atherosclerotic calcifications within the thoracic aorta.  The lungs are mildly hyperexpanded with mild diffuse thickening of pulmonary interstitium.  No focal airspace opacities. No pleural effusion or pneumothorax.  Multiple surgical clips overlie the lateral aspect of the left breast.  There is extensive degenerative change of the bilateral glenohumeral joints.  IMPRESSION: Hyperexpanded lungs without acute cardiopulmonary disease.   Original Report Authenticated By: Tacey Ruiz, MD    Dg Hip Complete Left  07/31/2012  *RADIOLOGY REPORT*  Clinical Data: Post fall, now with left hip pain  LEFT HIP - COMPLETE 2+ VIEW  Comparison: None.  Findings:  There is a subcapital fracture of the left femoral neck with associated foreshortening and varus angulation of the fracture fragments.  There is no definite extension of the fracture into the femoral articular surface.  Expected adjacent soft tissue swelling. No radiopaque foreign body.  No definite associated pelvic fracture.  Multilevel lumbar spine degenerative change. Vascular calcifications.  Multiple phleboliths overlie the pelvis.  IMPRESSION: Displaced subcapsular fracture of the left femoral neck with associated foreshortening and varus angulation.   Original Report Authenticated By: Tacey Ruiz, MD     Date: 07/31/2012  Rate: 103  Rhythm: normal sinus rhythm  QRS Axis: normal  Intervals: normal  ST/T Wave abnormalities: normal  Conduction Disutrbances:none  Narrative Interpretation:   Old EKG Reviewed: unchanged    1. Closed left hip fracture       MDM   Patient with a mechanical fall today with her walker. She was on the ground approximately 45 minutes prior to EMS arriving. Her left lower sternum and he is externally rotated and shortened. She has better pain control now with 4 mg of morphine. She does not have any other apparent areas of injury. Surgical clearance labs, EKG, chest x-ray and hip  films pending  5:10 PM Films show a broken hip. Will get admitted for surgical clearance.      Gwyneth Sprout, MD 07/31/12 1710  Gwyneth Sprout, MD 07/31/12 1739  Gwyneth Sprout, MD 07/31/12 1757

## 2012-07-31 NOTE — ED Notes (Signed)
Dr. Chandler at bedside.  

## 2012-08-01 ENCOUNTER — Inpatient Hospital Stay (HOSPITAL_COMMUNITY): Payer: Medicare Other | Admitting: Anesthesiology

## 2012-08-01 ENCOUNTER — Encounter (HOSPITAL_COMMUNITY): Payer: Self-pay | Admitting: Anesthesiology

## 2012-08-01 ENCOUNTER — Inpatient Hospital Stay (HOSPITAL_COMMUNITY): Payer: Medicare Other

## 2012-08-01 ENCOUNTER — Encounter (HOSPITAL_COMMUNITY)
Admission: EM | Disposition: A | Discharge status: Skilled Nursing Facility | Payer: Self-pay | Source: Home / Self Care | Attending: Internal Medicine

## 2012-08-01 DIAGNOSIS — I10 Essential (primary) hypertension: Secondary | ICD-10-CM

## 2012-08-01 DIAGNOSIS — F0390 Unspecified dementia without behavioral disturbance: Secondary | ICD-10-CM

## 2012-08-01 HISTORY — PX: HIP ARTHROPLASTY: SHX981

## 2012-08-01 LAB — BASIC METABOLIC PANEL
BUN: 14 mg/dL (ref 6–23)
CO2: 26 mEq/L (ref 19–32)
Chloride: 104 mEq/L (ref 96–112)
GFR calc non Af Amer: 78 mL/min — ABNORMAL LOW (ref >90)
Glucose, Bld: 105 mg/dL — ABNORMAL HIGH (ref 70–99)
Potassium: 4.1 mEq/L (ref 3.5–5.1)

## 2012-08-01 LAB — CBC
HCT: 36.5 % (ref 36.0–46.0)
Hemoglobin: 12.6 g/dL (ref 12.0–15.0)
MCHC: 34.5 g/dL (ref 30.0–36.0)

## 2012-08-01 SURGERY — HEMIARTHROPLASTY, HIP, DIRECT ANTERIOR APPROACH, FOR FRACTURE
Anesthesia: General | Site: Hip | Laterality: Left | Wound class: Clean

## 2012-08-01 MED ORDER — ACETAMINOPHEN 650 MG RE SUPP: 650.0000 mg | Freq: Four times a day (QID) | RECTAL | Status: DC | PRN

## 2012-08-01 MED ORDER — PROPOFOL 10 MG/ML IV BOLUS
INTRAVENOUS | Status: DC | PRN
  Administered 2012-08-01: 50 mg via INTRAVENOUS

## 2012-08-01 MED ORDER — HYDROMORPHONE HCL PF 1 MG/ML IJ SOLN
0.2500 mg | INTRAMUSCULAR | Status: DC | PRN
  Administered 2012-08-01: 0.5 mg via INTRAVENOUS

## 2012-08-01 MED ORDER — ACETAMINOPHEN 325 MG PO TABS: 650.0000 mg | ORAL_TABLET | Freq: Four times a day (QID) | ORAL | Status: DC | PRN

## 2012-08-01 MED ORDER — MENTHOL 3 MG MT LOZG: 1.0000 | LOZENGE | OROMUCOSAL | Status: DC | PRN

## 2012-08-01 MED ORDER — HYDROCODONE-ACETAMINOPHEN 5-325 MG PO TABS
1.0000 | ORAL_TABLET | Freq: Four times a day (QID) | ORAL | Status: DC | PRN
  Administered 2012-08-02: 1 via ORAL
  Filled 2012-08-01: qty 2

## 2012-08-01 MED ORDER — POLYETHYLENE GLYCOL 3350 17 G PO PACK: 17.0000 g | PACK | Freq: Every day | ORAL | Status: DC | PRN

## 2012-08-01 MED ORDER — ROCURONIUM BROMIDE 100 MG/10ML IV SOLN
INTRAVENOUS | Status: DC | PRN
  Administered 2012-08-01: 25 mg via INTRAVENOUS

## 2012-08-01 MED ORDER — DOCUSATE SODIUM 100 MG PO CAPS
100.0000 mg | ORAL_CAPSULE | Freq: Two times a day (BID) | ORAL | Status: DC
  Administered 2012-08-01 – 2012-08-05 (×7): 100 mg via ORAL
  Filled 2012-08-01 (×8): qty 1

## 2012-08-01 MED ORDER — NEOSTIGMINE METHYLSULFATE 1 MG/ML IJ SOLN
INTRAMUSCULAR | Status: DC | PRN
  Administered 2012-08-01: 3 mg via INTRAVENOUS

## 2012-08-01 MED ORDER — FENTANYL CITRATE 0.05 MG/ML IJ SOLN
INTRAMUSCULAR | Status: DC | PRN
  Administered 2012-08-01: 100 ug via INTRAVENOUS
  Administered 2012-08-01: 50 ug via INTRAVENOUS

## 2012-08-01 MED ORDER — BISACODYL 5 MG PO TBEC: 5.0000 mg | DELAYED_RELEASE_TABLET | Freq: Every day | ORAL | Status: DC | PRN

## 2012-08-01 MED ORDER — FLEET ENEMA 7-19 GM/118ML RE ENEM: 1.0000 | ENEMA | Freq: Once | RECTAL | Status: AC | PRN

## 2012-08-01 MED ORDER — ONDANSETRON HCL 4 MG/2ML IJ SOLN
INTRAMUSCULAR | Status: DC | PRN
  Administered 2012-08-01: 4 mg via INTRAVENOUS

## 2012-08-01 MED ORDER — PHENYLEPHRINE HCL 10 MG/ML IJ SOLN
10.0000 mg | INTRAMUSCULAR | Status: DC | PRN
  Administered 2012-08-01: 25 ug/min via INTRAVENOUS

## 2012-08-01 MED ORDER — DEXTROSE 5 % IV SOLN
INTRAVENOUS | Status: DC | PRN
  Administered 2012-08-01: 14:00:00 via INTRAVENOUS

## 2012-08-01 MED ORDER — ALBUMIN HUMAN 5 % IV SOLN
INTRAVENOUS | Status: DC | PRN
  Administered 2012-08-01: 15:00:00 via INTRAVENOUS

## 2012-08-01 MED ORDER — SODIUM CHLORIDE 0.9 % IV SOLN
INTRAVENOUS | Status: DC
  Administered 2012-08-02: 75 mL via INTRAVENOUS

## 2012-08-01 MED ORDER — LACTATED RINGERS IV SOLN
INTRAVENOUS | Status: DC | PRN
  Administered 2012-08-01 (×3): via INTRAVENOUS

## 2012-08-01 MED ORDER — LIDOCAINE HCL (CARDIAC) 20 MG/ML IV SOLN
INTRAVENOUS | Status: DC | PRN
  Administered 2012-08-01: 80 mg via INTRAVENOUS

## 2012-08-01 MED ORDER — PHENOL 1.4 % MT LIQD: 1.0000 | OROMUCOSAL | Status: DC | PRN

## 2012-08-01 MED ORDER — SODIUM CHLORIDE 0.9 % IR SOLN
Status: DC | PRN
  Administered 2012-08-01: 3000 mL

## 2012-08-01 MED ORDER — CEFAZOLIN SODIUM-DEXTROSE 2-3 GM-% IV SOLR
2.0000 g | Freq: Four times a day (QID) | INTRAVENOUS | Status: AC
  Administered 2012-08-01 – 2012-08-02 (×2): 2 g via INTRAVENOUS
  Filled 2012-08-01 (×2): qty 50

## 2012-08-01 MED ORDER — GLYCOPYRROLATE 0.2 MG/ML IJ SOLN
INTRAMUSCULAR | Status: DC | PRN
  Administered 2012-08-01: 0.4 mg via INTRAVENOUS

## 2012-08-01 MED ORDER — ARTIFICIAL TEARS OP OINT
TOPICAL_OINTMENT | OPHTHALMIC | Status: DC | PRN
  Administered 2012-08-01: 1 via OPHTHALMIC

## 2012-08-01 MED ORDER — ONDANSETRON HCL 4 MG/2ML IJ SOLN: 4.0000 mg | Freq: Four times a day (QID) | INTRAMUSCULAR | Status: DC | PRN

## 2012-08-01 MED ORDER — SODIUM CHLORIDE 0.9 % IR SOLN
Status: DC | PRN
  Administered 2012-08-01: 1

## 2012-08-01 MED ORDER — METOCLOPRAMIDE HCL 5 MG/ML IJ SOLN: 5.0000 mg | Freq: Three times a day (TID) | INTRAMUSCULAR | Status: DC | PRN

## 2012-08-01 MED ORDER — ONDANSETRON HCL 4 MG/2ML IJ SOLN: 4.0000 mg | Freq: Once | INTRAMUSCULAR | Status: AC | PRN

## 2012-08-01 MED ORDER — METOCLOPRAMIDE HCL 10 MG PO TABS: 5.0000 mg | ORAL_TABLET | Freq: Three times a day (TID) | ORAL | Status: DC | PRN

## 2012-08-01 MED ORDER — MORPHINE SULFATE 2 MG/ML IJ SOLN: 0.5000 mg | INTRAMUSCULAR | Status: DC | PRN

## 2012-08-01 MED ORDER — ONDANSETRON HCL 4 MG PO TABS: 4.0000 mg | ORAL_TABLET | Freq: Four times a day (QID) | ORAL | Status: DC | PRN

## 2012-08-01 MED ORDER — ASPIRIN EC 325 MG PO TBEC
325.0000 mg | DELAYED_RELEASE_TABLET | Freq: Two times a day (BID) | ORAL | Status: DC
  Administered 2012-08-01 – 2012-08-05 (×7): 325 mg via ORAL
  Filled 2012-08-01 (×11): qty 1

## 2012-08-01 SURGICAL SUPPLY — 62 items
BLADE SAW SAG 73X25 THK (BLADE) ×1
BLADE SAW SGTL 73X25 THK (BLADE) ×1 IMPLANT
BLADE SURG ROTATE 9660 (MISCELLANEOUS) IMPLANT
BRUSH FEMORAL CANAL (MISCELLANEOUS) ×1 IMPLANT
CEMENT BONE DEPUY (Cement) ×2 IMPLANT
CEMENT RESTRICTOR DEPUY SZ 3 (Cement) ×1 IMPLANT
CHLORAPREP W/TINT 26ML (MISCELLANEOUS) ×2 IMPLANT
CLOTH BEACON ORANGE TIMEOUT ST (SAFETY) ×2 IMPLANT
COVER SURGICAL LIGHT HANDLE (MISCELLANEOUS) ×2 IMPLANT
DRAPE INCISE IOBAN 66X45 STRL (DRAPES) ×1 IMPLANT
DRAPE ORTHO SPLIT 77X108 STRL (DRAPES) ×4
DRAPE PROXIMA HALF (DRAPES) ×2 IMPLANT
DRAPE SURG ORHT 6 SPLT 77X108 (DRAPES) ×2 IMPLANT
DRAPE U-SHAPE 47X51 STRL (DRAPES) ×2 IMPLANT
DRILL BIT 7/64X5 (BIT) ×2 IMPLANT
DRSG ADAPTIC 3X8 NADH LF (GAUZE/BANDAGES/DRESSINGS) ×2 IMPLANT
DRSG MEPILEX BORDER 4X8 (GAUZE/BANDAGES/DRESSINGS) ×1 IMPLANT
DRSG PAD ABDOMINAL 8X10 ST (GAUZE/BANDAGES/DRESSINGS) ×4 IMPLANT
ELECT BLADE 6.5 EXT (BLADE) IMPLANT
ELECT REM PT RETURN 9FT ADLT (ELECTROSURGICAL) ×2
ELECTRODE REM PT RTRN 9FT ADLT (ELECTROSURGICAL) ×1 IMPLANT
EVACUATOR 1/8 PVC DRAIN (DRAIN) IMPLANT
GLOVE BIO SURGEON STRL SZ7 (GLOVE) ×2 IMPLANT
GLOVE BIO SURGEON STRL SZ7.5 (GLOVE) ×2 IMPLANT
GLOVE BIOGEL PI IND STRL 8 (GLOVE) ×1 IMPLANT
GLOVE BIOGEL PI INDICATOR 8 (GLOVE) ×1
GLOVE SS BIOGEL STRL SZ 8 (GLOVE) ×1 IMPLANT
GLOVE SUPERSENSE BIOGEL SZ 8 (GLOVE) ×1
GOWN PREVENTION PLUS LG XLONG (DISPOSABLE) ×2 IMPLANT
GOWN STRL NON-REIN LRG LVL3 (GOWN DISPOSABLE) ×4 IMPLANT
HANDPIECE INTERPULSE COAX TIP (DISPOSABLE) ×2
HOOD PEEL AWAY FACE SHEILD DIS (HOOD) ×4 IMPLANT
IMMOBILIZER KNEE 20 (SOFTGOODS) ×4
IMMOBILIZER KNEE 20 THIGH 36 (SOFTGOODS) IMPLANT
IMMOBILIZER KNEE 22 UNIV (SOFTGOODS) IMPLANT
IMMOBILIZER KNEE 24 THIGH 36 (MISCELLANEOUS) IMPLANT
IMMOBILIZER KNEE 24 UNIV (MISCELLANEOUS)
KIT BASIN OR (CUSTOM PROCEDURE TRAY) ×2 IMPLANT
KIT ROOM TURNOVER OR (KITS) ×2 IMPLANT
MANIFOLD NEPTUNE II (INSTRUMENTS) ×2 IMPLANT
NDL MAYO TROCAR (NEEDLE) ×1 IMPLANT
NEEDLE MAYO TROCAR (NEEDLE) ×2 IMPLANT
NS IRRIG 1000ML POUR BTL (IV SOLUTION) ×2 IMPLANT
PACK TOTAL JOINT (CUSTOM PROCEDURE TRAY) ×2 IMPLANT
PAD ARMBOARD 7.5X6 YLW CONV (MISCELLANEOUS) ×4 IMPLANT
PASSER SUT SWANSON 36MM LOOP (INSTRUMENTS) ×1 IMPLANT
PRESSURIZER FEMORAL UNIV (MISCELLANEOUS) ×1 IMPLANT
SET HNDPC FAN SPRY TIP SCT (DISPOSABLE) IMPLANT
SPONGE GAUZE 4X4 12PLY (GAUZE/BANDAGES/DRESSINGS) ×2 IMPLANT
STAPLER VISISTAT 35W (STAPLE) ×2 IMPLANT
SUCTION FRAZIER TIP 10 FR DISP (SUCTIONS) ×2 IMPLANT
SUT ETHIBOND NAB CT1 #1 30IN (SUTURE) ×6 IMPLANT
SUT VIC AB 0 CT1 27 (SUTURE) ×2
SUT VIC AB 0 CT1 27XBRD ANBCTR (SUTURE) ×1 IMPLANT
SUT VIC AB 1 CTB1 27 (SUTURE) ×4 IMPLANT
SUT VIC AB 2-0 CT1 27 (SUTURE) ×2
SUT VIC AB 2-0 CT1 TAPERPNT 27 (SUTURE) ×1 IMPLANT
TOWEL OR 17X24 6PK STRL BLUE (TOWEL DISPOSABLE) ×2 IMPLANT
TOWEL OR 17X26 10 PK STRL BLUE (TOWEL DISPOSABLE) ×2 IMPLANT
TOWER CARTRIDGE SMART MIX (DISPOSABLE) ×1 IMPLANT
TRAY FOLEY CATH 14FR (SET/KITS/TRAYS/PACK) IMPLANT
WATER STERILE IRR 1000ML POUR (IV SOLUTION) ×8 IMPLANT

## 2012-08-01 NOTE — Preoperative (Signed)
Beta Blockers   Reason not to administer Beta Blockers:Not Applicable 

## 2012-08-01 NOTE — Anesthesia Postprocedure Evaluation (Signed)
  Anesthesia Post-op Note  Patient: Morgan [name]  Procedure(s) Performed: Procedure(s) (LRB) with comments: ARTHROPLASTY BIPOLAR HIP (Left) - Surgeon requests to follow 1st case.  Patient Location: PACU  Anesthesia Type:General  Level of Consciousness: awake, alert  and patient cooperative  Airway and Oxygen Therapy: Patient Spontanous Breathing  Post-op Pain: mild  Post-op Assessment: Post-op Vital signs reviewed, Patient's Cardiovascular Status Stable, Respiratory Function Stable, Patent Airway, No signs of Nausea or vomiting and Pain level controlled  Post-op Vital Signs: stable  Complications: No apparent anesthesia complications

## 2012-08-01 NOTE — Op Note (Signed)
Procedure(s): ARTHROPLASTY BIPOLAR HIP Procedure Note  Morgan [name] female 78 y.o. 08/01/2012  Procedure(s) and Anesthesia Type:    * HEMIARTHROPLASTY LEFT HIP - General  Surgeon(s) and Role:    * Mable Paris, MD - Primary   Indications:  78 y.o. female s/p fall with left hip fracture. Indicated for surgery to promote early ambulation, pain control and prevent complications of bed rest.     Surgeon: Mable Paris   Assistants: Damita Lack PA-C (Danielle was present and scrubbed throughout the procedure and was essential in positioning, retraction, exposure, and closure)  Anesthesia: General endotracheal anesthesia   Procedure Detail  Findings: DePuy cemented hemiarthroplasty with a size 4 stem and a 46 head with a +5 neck. Excellent stability.  Estimated Blood Loss:  200 mL         Drains: none  Blood Given: none          Specimens: none        Complications:  * No complications entered in OR log *         Disposition: PACU - hemodynamically stable.         Condition: stable    Procedure:  The patient was identified in the preoperative  holding area where I personally marked the operative site after  verifying site side and procedure with the patient. She was taken back  to the operating room where general anesthesia was induced without  Complication. The patient was placed in lateral decubitus position with the  left side up. The left lower extremity was then prepped and draped in standard sterile fashion. The patient did receive IV antibiotics prior to the  incision.   After the appropriate time-out, an approximately 12-cm  incision was made over the posterior third of the greater trochanter.  Dissection was carried down to the fascia which was split longitudinally  in line with the incision. The piriformis was tagged and the external  rotators were then taken down off the posterior greater trochanter in 1  sheath with the  posterior capsule. The fracture was exposed. Posterior  capsule and external rotators were split just below the piriformis down  to the level of the acetabulum. Great care was taken to protect the  sciatic nerve. The proximal femoral cut was then made using the cut  guide and the femoral head was then removed and sized and felt to be 46 mm.  The proximal femur was then prepared by first using an intramedullary  canal finder, a lateralizer and then sequentially broaching from 1 to 4.   The size 46 head with 0 offset neck was then placed and a trial reduction was performed. The +5 neck was felt to be the most appropriate soft tissue tension and stability. It was felt  to be excellent in terms of the soft tissue tension. I was able to  bring up to 90 degrees of flexion and 70 degrees internal rotation  without any instability. Leg lengths were felt to be appropriate. The  hip was dislocated. The broach was then removed. The cement restrictor  was then placed and the canal was prepared with pulse lavage with a  brush. The size 4 stem was then cemented into place in approximately 15-20  degrees anteversion. It was held until the cement was hardened and  cool. The size 46 +5 offset head was then placed and impacted. It  was then reduced after ensuring that there was nothing in the  acetabulum. The hip reduced  nicely and again it was taken through the  trial. I was able to flex to 90 degrees, internal rotation to 70  without any instability. Soft tissue tension was excellent and lengths  were felt to be equal. The joint was then copiously irrigated with  normal saline with pulse lavage and then the external rotators were  repaired using Ethibond sutures through the greater trochanter and tied  over a bone bridge. The deep fascia was then closed using #1 Vicryl in  running fashion proximally and distally. The skin was then closed using  2-0 Vicryl in deep dermal layer and staples for skin closure.  Sterile  dressings were then applied including Mepilex dressing. The patient was  then placed in an abduction pillow, rolled into supine position and  extubated. He was then transferred to the PACU in stable condition.    POSTOPERATIVE PLAN: The patient will be weightbearing as tolerated on the operative Extremity with posterior hip precautions and will have DVT prophylaxis of SCDs and aspirin.

## 2012-08-01 NOTE — Anesthesia Preprocedure Evaluation (Addendum)
Anesthesia Evaluation  Patient identified by MRN, date of birth, ID band Patient awake    Reviewed: Allergy & Precautions, H&P , NPO status , Patient's Chart, lab work & pertinent test results  Airway Mallampati: I TM Distance: >3 FB Neck ROM: full    Dental  (+) Edentulous Upper, Edentulous Lower and Dental Advisory Given   Pulmonary    Pulmonary exam normal       Cardiovascular hypertension, Pt. on medications Rhythm:regular Rate:Normal     Neuro/Psych PSYCHIATRIC DISORDERS Anxiety  Neuromuscular disease    GI/Hepatic negative GI ROS, Neg liver ROS,   Endo/Other  negative endocrine ROS  Renal/GU negative Renal ROS     Musculoskeletal  (+) Arthritis -, Osteoarthritis,    Abdominal Normal abdominal exam  (+)   Peds  Hematology negative hematology ROS (+)   Anesthesia Other Findings Dementia/ Oriented to person and place Poor Historian.  Pt does not want to remove upper dentures.  Informed daughter of risk of dental injury during surgery.  Reproductive/Obstetrics                         Anesthesia Physical Anesthesia Plan  ASA: II  Anesthesia Plan: General   Post-op Pain Management:    Induction: Intravenous  Airway Management Planned: Oral ETT  Additional Equipment:   Intra-op Plan:   Post-operative Plan: Extubation in OR  Informed Consent: I have reviewed the patients History and Physical, chart, labs and discussed the procedure including the risks, benefits and alternatives for the proposed anesthesia with the patient or authorized representative who has indicated his/her understanding and acceptance.   Dental advisory given  Plan Discussed with: CRNA, Anesthesiologist and Surgeon  Anesthesia Plan Comments:        Anesthesia Quick Evaluation

## 2012-08-01 NOTE — Progress Notes (Signed)
[facility] PROGRESS NOTE  Morgan [name] ZOX:[id number] DOB: 1927-01-14 DOA: 07/31/2012 PCP: Georgianne Fick, MD  Assessment/Plan: Principal Problem:  *Hip fracture, left for the OR today Active Problems:  Senile dementia: When necessary and for acute delirium. Hopefully can minimize use   HTN (hypertension), malignant: When necessary Lopressor before surgery   Noted low-grade temps. Temperature 100.3. Continue to follow. Urine and chest x-ray unremarkable.  Code Status: Full code Family Communication: Discussed with patient's daughter and son-in-law at the bedside today  Disposition Plan: For skilled nursing facility Monday or Tuesday   Consultants:  [facility]-orthopedic surgery  Procedures:  For hip repair today  Antibiotics:  None  HPI/Subjective: Patient slightly less agitated today. No complaints. No chest pain or shortness of breath or pain. Still chronically confused.  Objective: Filed Vitals:   08/01/12 0000 08/01/12 0400 08/01/12 0600 08/01/12 1204  BP:   186/73 160/56  Pulse:   109 100  Temp:   98 F (36.7 C) 100.3 F (37.9 C)  Resp: 16 16 16 18   SpO2:   96% 98%    Intake/Output Summary (Last 24 hours) at 08/01/12 1248 Last data filed at 08/01/12 1206  Gross per 24 hour  Intake      0 ml  Output    650 ml  Net   -650 ml   There were no vitals filed for this visit.  Exam:   General:  Alert and oriented x1, no acute distress, fatigue, looks about stated age  Cardiovascular:  regular rhythm, borderline tachycardia  Respiratory: Clear to auscultation bilaterally  Abdomen: Soft, nontender, nondistended positive bowel sounds  Extremities: No clubbing or cyanosis or edema  Data Reviewed: Basic Metabolic Panel:  Lab 08/01/12 8119 07/31/12 1639  NA 139 140  K 4.1 4.2  CL 104 102  CO2 26 28  GLUCOSE 105* 135*  BUN 14 16  CREATININE 0.69 0.72  CALCIUM 8.9 9.5  MG -- --  PHOS -- --   CBC:  Lab 08/01/12 0505 07/31/12 1639    WBC 7.3 10.7*  NEUTROABS -- 9.9*  HGB 12.6 13.5  HCT 36.5 39.5  MCV 89.0 89.0  PLT 185 227     Recent Results (from the past 240 hour(s))  SURGICAL PCR SCREEN     Status: Normal   Collection Time   08/01/12  4:23 AM      Component Value Range Status Comment   MRSA, PCR NEGATIVE  NEGATIVE Final    Staphylococcus aureus NEGATIVE  NEGATIVE Final      Studies: Dg Chest 2 View  07/31/2012    IMPRESSION: Hyperexpanded lungs without acute cardiopulmonary disease.   Original Report Authenticated By: Tacey Ruiz, MD    Dg Hip Complete Left  07/31/2012   IMPRESSION: Displaced subcapsular fracture of the left femoral neck with associated foreshortening and varus angulation.   Original Report Authenticated By: Tacey Ruiz, MD     Scheduled Meds:   .  ceFAZolin (ANCEF) IV  2 g Intravenous 60 min Pre-Op  . [COMPLETED] LORazepam  1 mg Intravenous NOW  . [COMPLETED] metoprolol  5 mg Intravenous Once  . [COMPLETED]  morphine injection  4 mg Intravenous Once   Continuous Infusions:   . 0.45 % NaCl with KCl 20 mEq / L 100 mL/hr at 08/01/12 1042   Time spent: 15 min    Hollice Espy  [facility] Pager [phone]. If 8PM-8AM, please contact night-coverage at [email or web address], password [facility] 08/01/2012, 12:48 PM  LOS: 1 day

## 2012-08-01 NOTE — Progress Notes (Signed)
Orthopedic Tech Progress Note Patient Details:  Morgan [name] 1927-05-27 [id number] OHF with trapeze bar applied to bed. Spoke with nurse and nurse stated that patient has dementia and she is not sure how much the patient will actually be able to use OHF but to go ahead with application. Patient ID: [name], female   DOB: October 18, 1926, 78 y.o.   MRN: [id number]   Orie Rout 08/01/2012, 11:57 AM

## 2012-08-01 NOTE — Anesthesia Procedure Notes (Signed)
Procedure Name: Intubation Date/Time: 08/01/2012 1:36 PM Performed by: Tyrone Nine Pre-anesthesia Checklist: Patient identified, Timeout performed, Emergency Drugs available, Suction available and Patient being monitored Patient Re-evaluated:Patient Re-evaluated prior to inductionOxygen Delivery Method: Circle system utilized Preoxygenation: Pre-oxygenation with 100% oxygen Intubation Type: IV induction Ventilation: Mask ventilation without difficulty Laryngoscope Size: Mac and 3 Grade View: Grade I Tube type: Oral Tube size: 7.0 mm Number of attempts: 1 Airway Equipment and Method: Stylet Placement Confirmation: ETT inserted through vocal cords under direct vision,  positive ETCO2 and breath sounds checked- equal and bilateral Secured at: 21 cm Tube secured with: Tape Dental Injury: Teeth and Oropharynx as per pre-operative assessment

## 2012-08-01 NOTE — Progress Notes (Signed)
INITIAL ADULT NUTRITION ASSESSMENT Date: 08/01/2012   Time: 2:21 PM Reason for Assessment: MST  INTERVENTION: Supplement diet as needed  DOCUMENTATION CODES Per approved criteria  -Not Applicable    ASSESSMENT: Female 78 y.o.  Dx: Hip fracture, left  Hx:  Past Medical History  Diagnosis Date  . Hypertension    Past Surgical History  Procedure Date  . Lumpectormy    Related Meds:  Scheduled Meds:    . [COMPLETED]  ceFAZolin (ANCEF) IV  2 g Intravenous 60 min Pre-Op  . [COMPLETED] LORazepam  1 mg Intravenous NOW  . [COMPLETED] metoprolol  5 mg Intravenous Once  . [COMPLETED]  morphine injection  4 mg Intravenous Once   Continuous Infusions:    . 0.45 % NaCl with KCl 20 mEq / L 100 mL/hr at 08/01/12 1042   PRN Meds:.hydrALAZINE, [EXPIRED] HYDROcodone-acetaminophen, HYDROcodone-acetaminophen, LORazepam, metoprolol, [EXPIRED] morphine, morphine injection, [EXPIRED] ondansetron, sodium chloride irrigation, sodium chloride irrigation   Ht:   5\' 6"  (167.6 cm)  Wt:   134 lb (60.9 kg)  Ideal Wt:    59 kg % Ideal Wt: 103%  Usual Wt:  Wt Readings from Last 10 Encounters:  No data found for Wt   % Usual Wt: 100%  BMI: 21.6 WNL   Food/Nutrition Related Hx: per daughter good appetite, no recent weight changes PTA  Labs:  CMP     Component Value Date/Time   NA 139 08/01/2012 0505   K 4.1 08/01/2012 0505   CL 104 08/01/2012 0505   CO2 26 08/01/2012 0505   GLUCOSE 105* 08/01/2012 0505   BUN 14 08/01/2012 0505   CREATININE 0.69 08/01/2012 0505   CALCIUM 8.9 08/01/2012 0505   PROT 6.2 05/25/2008 1022   ALBUMIN 3.7 05/25/2008 1022   AST 13 05/25/2008 1022   ALT <8 05/25/2008 1022   ALKPHOS 48 05/25/2008 1022   BILITOT 0.7 05/25/2008 1022   GFRNONAA 78* 08/01/2012 0505   GFRAA >90 08/01/2012 0505  CBG (last 3)  No results found for this basename: GLUCAP:3 in the last 72 hours   Intake/Output Summary (Last 24 hours) at 08/01/12 1421 Last data filed at 08/01/12  1404  Gross per 24 hour  Intake   1100 ml  Output    650 ml  Net    450 ml     Diet Order: NPO  Supplements/Tube Feeding: none  IVF:     0.45 % NaCl with KCl 20 mEq / L Last Rate: 100 mL/hr at 08/01/12 1042   Pt with hx of advanced dementia who fell out of bed, pt dx with left hip fracture. Plan for repair 11/23. Daughter lives near by. Per notes will need SNF at d/c.  Pt lives alone with cg.   Estimated Nutritional Needs:   Kcal:  1400-1600 Protein:  68-75 Fluid:  >1.5 L/day  NUTRITION DIAGNOSIS: Inadequate oral intake related to inability to eat as evidenced by NPO status.  MONITORING/EVALUATION(Goals): Goal: Pt to meet >/= 90% of their estimated nutrition needs.  Monitor: diet advancement/tolerance, PO intake  EDUCATION NEEDS: -No education needs identified at this time  Kendell Bane RD, LDN, CNSC [phone] Weekend/After Hours Pager  08/01/2012, 2:21 PM

## 2012-08-01 NOTE — Progress Notes (Signed)
PATIENT ID: Morgan [name]     Procedure(s) (LRB): ARTHROPLASTY BIPOLAR HIP (Left)  Subjective: Patient sleeping comfortably.   Objective:  Filed Vitals:   08/01/12 0600  BP: 186/73  Pulse: 109  Temp: 98 F (36.7 C)  Resp: 16    Sleeping, difficult to arouse LLE shortened, ER. CR <2sec TTP L hip   Labs:   [facility] 08/01/12 0505 07/31/12 1639  HGB 12.6 13.5   Basename 08/01/12 0505 07/31/12 1639  WBC 7.3 10.7*  RBC 4.10 4.44  HCT 36.5 39.5  PLT 185 227   Basename 08/01/12 0505 07/31/12 1639  NA 139 140  K 4.1 4.2  CL 104 102  CO2 26 28  BUN 14 16  CREATININE 0.69 0.72  GLUCOSE 105* 135*  CALCIUM 8.9 9.5    Assessment and Plan: Left displaced femoral neck fracture  Plan for surgery today, left hip hemiarthroplasty  N.p.o. for surgery   VTE proph: SCDs

## 2012-08-01 NOTE — Transfer of Care (Signed)
Immediate Anesthesia Transfer of Care Note  Patient: Morgan [name]  Procedure(s) Performed: Procedure(s) (LRB) with comments: ARTHROPLASTY BIPOLAR HIP (Left) - Surgeon requests to follow 1st case.  Patient Location: PACU  Anesthesia Type:General  Level of Consciousness: sedated, patient cooperative and responds to stimulation  Airway & Oxygen Therapy: Patient Spontanous Breathing and Patient connected to face mask oxygen  Post-op Assessment: Report given to PACU RN and Post -op Vital signs reviewed and stable  Post vital signs: Reviewed and stable  Complications: No apparent anesthesia complications

## 2012-08-01 NOTE — Progress Notes (Signed)
UR COMPLETED.   Delaine Canter Wise Elane Peabody, RN, BSN    Phone #[phone] 

## 2012-08-02 DIAGNOSIS — F0390 Unspecified dementia without behavioral disturbance: Secondary | ICD-10-CM

## 2012-08-02 DIAGNOSIS — D62 Acute posthemorrhagic anemia: Secondary | ICD-10-CM | POA: Diagnosis present

## 2012-08-02 LAB — CBC
MCH: 31.1 pg (ref 26.0–34.0)
MCHC: 35.1 g/dL (ref 30.0–36.0)
MCV: 88.4 fL (ref 78.0–100.0)
Platelets: 156 10*3/uL (ref 150–400)
RDW: 12.7 % (ref 11.5–15.5)

## 2012-08-02 LAB — BASIC METABOLIC PANEL
CO2: 23 mEq/L (ref 19–32)
Calcium: 8 mg/dL — ABNORMAL LOW (ref 8.4–10.5)
Creatinine, Ser: 1.05 mg/dL (ref 0.50–1.10)
Glucose, Bld: 113 mg/dL — ABNORMAL HIGH (ref 70–99)

## 2012-08-02 MED ORDER — MORPHINE SULFATE 2 MG/ML IJ SOLN
1.0000 mg | INTRAMUSCULAR | Status: DC | PRN
  Administered 2012-08-02: 1 mg via INTRAVENOUS
  Administered 2012-08-02: 2 mg via INTRAVENOUS
  Filled 2012-08-02 (×2): qty 1

## 2012-08-02 MED ORDER — ZOLPIDEM TARTRATE 5 MG PO TABS: 5.0000 mg | ORAL_TABLET | Freq: Every evening | ORAL | Status: DC | PRN

## 2012-08-02 MED ORDER — METOPROLOL TARTRATE 1 MG/ML IV SOLN
5.0000 mg | Freq: Four times a day (QID) | INTRAVENOUS | Status: DC
  Administered 2012-08-02 – 2012-08-03 (×4): 5 mg via INTRAVENOUS
  Filled 2012-08-02 (×10): qty 5

## 2012-08-02 MED ORDER — FUROSEMIDE 10 MG/ML IJ SOLN
20.0000 mg | Freq: Once | INTRAMUSCULAR | Status: AC
  Administered 2012-08-02: 20 mg via INTRAVENOUS
  Filled 2012-08-02: qty 2

## 2012-08-02 NOTE — Progress Notes (Signed)
[facility] PROGRESS NOTE  Morgan [name] GNF:[id number] DOB: [birth year] DOA: 07/31/2012 PCP: Georgianne Fick, MD  Assessment/Plan: Principal Problem:  *Hip fracture, status post repair-postop day 1  Acute blood loss anemia from surgery: Significant drop in 24-hour period. Receiving blood transfusion today and. Acute drop may account for some of her acute delirium  Active Problems:  Senile dementia with acute delirium. Discussed with family unless patient is clearly indicating that she is in pain by her hip, but really try to limit narcotics. He'll narcotics are likely contributing to her increasing agitation plus lack of appetite. In addition, setting also playing a role   HTN (hypertension), malignant: Changed when necessary Lopressor to schedule plus when necessary hydralazine     Code Status: Full code Family Communication: Discussed with patient's daughter and granddaughter today at the bedside Disposition Plan: For skilled nursing facility likely Tuesday Tuesday   Consultants:  [facility]-orthopedic surgery  Procedures:  For hip repair today  Antibiotics:  None  HPI/Subjective: Somewhat agitated. Reportedly was in significant pain earlier when she was sitting in the chair. Currently in bed, a little bit more delirious  Objective: Filed Vitals:   08/01/12 2205 08/02/12 0028 08/02/12 0400 08/02/12 0653  BP: 132/57 122/49  142/93  Pulse: 129 123  120  Temp: 99.3 F (37.4 C) 99.8 F (37.7 C)  99.9 F (37.7 C)  TempSrc: Oral Axillary  Oral  Resp: 19 22 16 20   Height:      Weight:      SpO2: 98% 92% 95% 95%    Intake/Output Summary (Last 24 hours) at 08/02/12 1456 Last data filed at 08/02/12 0600  Gross per 24 hour  Intake    200 ml  Output    365 ml  Net   -165 ml   Filed Weights   08/01/12 1300  Weight: 60.782 kg (134 lb)    Exam:   General:  Delirious, moderate distress but looks to more agitation and pain, fatigued  Cardiovascular:   regular rhythm, borderline tachycardia  Respiratory: Clear to auscultation bilaterally  Abdomen: Soft, nontender, nondistended positive bowel sounds  Extremities: No clubbing or cyanosis or edema  Data Reviewed: Basic Metabolic Panel:  Lab 08/02/12 8469 08/01/12 0505 07/31/12 1639  NA 131* 139 140  K 4.5 4.1 4.2  CL 98 104 102  CO2 23 26 28   GLUCOSE 113* 105* 135*  BUN 20 14 16   CREATININE 1.05 0.69 0.72  CALCIUM 8.0* 8.9 9.5  MG -- -- --  PHOS -- -- --   CBC:  Lab 08/02/12 0855 08/01/12 0505 07/31/12 1639  WBC 7.3 7.3 10.7*  NEUTROABS -- -- 9.9*  HGB 7.8* 12.6 13.5  HCT 22.2* 36.5 39.5  MCV 88.4 89.0 89.0  PLT 156 185 227     Recent Results (from the past 240 hour(s))  SURGICAL PCR SCREEN     Status: Normal   Collection Time   08/01/12  4:23 AM      Component Value Range Status Comment   MRSA, PCR NEGATIVE  NEGATIVE Final    Staphylococcus aureus NEGATIVE  NEGATIVE Final      Studies: Dg Chest 2 View  07/31/2012    IMPRESSION: Hyperexpanded lungs without acute cardiopulmonary disease.   Original Report Authenticated By: Tacey Ruiz, MD    Dg Hip Complete Left  07/31/2012   IMPRESSION: Displaced subcapsular fracture of the left femoral neck with associated foreshortening and varus angulation.   Original Report Authenticated By: Tacey Ruiz, MD  Scheduled Meds:    . aspirin EC  325 mg Oral BID  . [COMPLETED]  ceFAZolin (ANCEF) IV  2 g Intravenous Q6H  . docusate sodium  100 mg Oral BID  . metoprolol  5 mg Intravenous Q6H   Continuous Infusions:    . 0.45 % NaCl with KCl 20 mEq / L 100 mL/hr at 08/01/12 1042  . sodium chloride 75 mL (08/02/12 0245)   Time spent: 25 minutes    Hollice Espy  [facility] Pager [phone]. If 8PM-8AM, please contact night-coverage at [email or web address], password [facility] 08/02/2012, 2:56 PM  LOS: 2 days

## 2012-08-02 NOTE — Progress Notes (Signed)
Physical Therapy Evaluation Patient Details Name: Morgan [name] MRN: [id number] DOB: 1926-12-19 Today's Date: 08/02/2012 Time: 4098-1191 PT Time Calculation (min): 30 min  PT Assessment / Plan / Recommendation Clinical Impression  78 yo female admitted for L hip fracture, now s/p L Hip Hemiarthroplasty; Presents with decr functional moiblity; Will benefit from PT to maximize independence and safety with mobility, and to facilitate dc planning, decr caregiver burder    PT Assessment  Patient needs continued PT services    Follow Up Recommendations  SNF    Does the patient have the potential to tolerate intense rehabilitation      Barriers to Discharge Decreased caregiver support;Inaccessible home environment      Equipment Recommendations  Other (comment) (to be determined)    Recommendations for Other Services     Frequency Min 3X/week (may increase to 5x/wk as cognition improves)    Precautions / Restrictions Precautions Precautions: Posterior Hip;Fall Precaution Comments: Educated pt's daughter in posterior hip prec Required Braces or Orthoses:  (KI was on pt, but not oredered) Restrictions Weight Bearing Restrictions: Yes LLE Weight Bearing: Weight bearing as tolerated   Pertinent Vitals/Pain Session conducted o Room air, and O2 sats greater than or equal to 96% Pt at times looking uncomfortable, with grimace, likely secondary to pain; Repositioned in chair; RN notified      Mobility  Bed Mobility Bed Mobility: Supine to Sit;Sitting - Scoot to Edge of Bed Supine to Sit: 1: +2 Total assist;HOB elevated Supine to Sit: Patient Percentage: 20% Sitting - Scoot to Edge of Bed: 1: +2 Total assist Sitting - Scoot to Edge of Bed: Patient Percentage: 30% Details for Bed Mobility Assistance: Requiring physical assist for any motion, including total support given by tech from behind as well Transfers Transfers: Sit to Stand;Stand to Sit;Stand Pivot Transfers Sit to  Stand: 2: Max assist Stand to Sit: 2: Max Multimedia programmer Transfers: 1: +2 Total assist Stand Pivot Transfers: Patient Percentage: 30% Details for Transfer Assistance: Attempted straight sit to /from stand from chair to help with hygeine; PT only able to clear hips from seated surface with max assist; +2 assist to basic pivot transfer OOB to chair placed on pt's stronger R side Ambulation/Gait Ambulation/Gait Assistance: Not tested (comment)    Shoulder Instructions     Exercises     PT Diagnosis: Difficulty walking;Generalized weakness;Acute pain  PT Problem List: Decreased strength;Decreased range of motion;Decreased activity tolerance;Decreased balance;Decreased mobility;Decreased coordination;Decreased cognition;Decreased knowledge of use of DME;Decreased safety awareness;Decreased knowledge of precautions;Pain PT Treatment Interventions: DME instruction;Gait training;Functional mobility training;Therapeutic activities;Therapeutic exercise;Cognitive remediation;Patient/family education   PT Goals Acute Rehab PT Goals PT Goal Formulation: With family Time For Goal Achievement: 08/16/12 Potential to Achieve Goals: Fair Pt will go Supine/Side to Sit: with min assist PT Goal: Supine/Side to Sit - Progress: Goal set today Pt will go Sit to Supine/Side: with min assist PT Goal: Sit to Supine/Side - Progress: Goal set today Pt will go Sit to Stand: with min assist PT Goal: Sit to Stand - Progress: Goal set today Pt will go Stand to Sit: with min assist PT Goal: Stand to Sit - Progress: Goal set today Pt will Transfer Bed to Chair/Chair to Bed: with min assist PT Transfer Goal: Bed to Chair/Chair to Bed - Progress: Goal set today Pt will Ambulate: 51 - 150 feet;with min assist;with rolling walker PT Goal: Ambulate - Progress: Goal set today  Visit Information  Last PT Received On: 08/02/12 Assistance Needed: +2  Subjective Data  Subjective: Eyes open; making eye contact 50% of  time; making vocalizations, but not intelligible verbalizations Patient Stated Goal: Unable to state   Prior Functioning  Home Living Lives With: Alone Available Help at Discharge: Personal care attendant;Available PRN/intermittently ([place] daily, help swith evening meal and prep for bed) Type of Home: House Home Access: Stairs to enter [employer] of Steps: 4 Entrance Stairs-Rails: None (one rail, not sure which side) Home Layout: One level Home Adaptive Equipment: Walker - rolling Additional Comments: Per daughter, pt was able to manage her morning ADLs independently, with clothing laid out for her by helper the night before Prior Function Level of Independence: Independent with assistive device(s) Able to Take Stairs?: Yes (with assist and cane) Driving: No Communication Communication: Receptive difficulties;Expressive difficulties (likely secondary to pain meds)    Cognition  Overall Cognitive Status: History of cognitive impairments - further impaired Area of Impairment: Following commands;Other (comment) (decr cognitive status likely secondary to pain meds) Arousal/Alertness: Lethargic Orientation Level: Disoriented to;Place;Time;Situation (was able to make eyecontact to her name being spoken) Behavior During Session: Restless (once sitting up) Following Commands: Follows one step commands inconsistently Cognition - Other Comments: Pt has dementia, but Clearly not at the baseline her daughter described    Extremity/Trunk Assessment Right Upper Extremity Assessment RUE ROM/Strength/Tone: [facility] for tasks assessed RUE Coordination:  (restless hands) Left Upper Extremity Assessment LUE ROM/Strength/Tone: WFL for tasks assessed LUE Coordination:  (restless hands) Right Lower Extremity Assessment RLE ROM/Strength/Tone: Deficits RLE ROM/Strength/Tone Deficits: Generally weak, not moving RLE well, limited by pain, and decr cognition postop Left Lower Extremity  Assessment LLE ROM/Strength/Tone: Deficits;Due to pain LLE ROM/Strength/Tone Deficits: Decr AROM and strength, limited by pain postop; Noted valgus deformity   Balance Balance Balance Assessed: Yes Static Sitting Balance Static Sitting - Balance Support: Right upper extremity supported;Left upper extremity supported;Feet supported Static Sitting - Level of Assistance: 3: Mod assist;2: Max assist Static Sitting - Comment/# of Minutes: EOB approx 5-7 minutes in prep for transfer to chair  End of Session PT - End of Session Equipment Utilized During Treatment: Gait belt;Left knee immobilizer (though KI not ordered) Activity Tolerance: Patient limited by pain;Treatment limited secondary to medication (decr cognition likely secondary to pain meds) Patient left: in chair;with call bell/phone within reach;with family/visitor present Nurse Communication: Mobility status  GP     Olen Pel [place], [place] [phone]  08/02/2012, 10:21 AM

## 2012-08-02 NOTE — Clinical Social Work Note (Addendum)
Clinical Social Work Department CLINICAL SOCIAL WORK PLACEMENT NOTE 08/02/2012  Patient:  KAMILYA, WAKEMAN  Account Number:  [id number] Admit date:  07/31/2012  Clinical Social Worker:  Skip Mayer  Date/time:  08/02/2012 12:45 PM  Clinical Social Work is seeking post-discharge placement for this patient at the following level of care:   SKILLED NURSING   (*CSW will update this form in Epic as items are completed)   08/02/2012  Patient/family provided with [facility] Department of Clinical Social Work's list of facilities offering this level of care within the geographic area requested by the patient (or if unable, by the patient's family).  08/02/2012  Patient/family informed of their freedom to choose among providers that offer the needed level of care, that participate in Medicare, Medicaid or managed care program needed by the patient, have an available bed and are willing to accept the patient.  08/02/2012  Patient/family informed of [facility]' ownership interest in [facility], as well as of the fact that they are under no obligation to receive care at this facility.  PASARR submitted to EDS on 08/02/2012 PASARR number received from EDS on 08/02/2012  FL2 transmitted to all facilities in geographic area requested by pt/family on  08/02/2012 FL2 transmitted to all facilities within larger geographic area on   Patient informed that his/her managed care company has contracts with or will negotiate with  certain facilities, including the following:     Patient/family informed of bed offers received:  08/03/2012  (DTC) Patient chooses bed at  [facility] (DTC) Physician recommends and patient chooses bed at    Patient to be transferred to [facility] on    Patient to be transferred to facility by [facility])  The following physician request were entered in Epic:   Additional Comments:08/03/12 Patient's daughter is aware of d/c plan. Patient was  asleep this afternoon Anticipate d/c to [facility] if patient is medically stable tomorrow. Facility will come to sign preadmit papers with family tomorrow around lunchtime. Lorri Frederick. [place]  [phone]   08/04/12  Patient was scheduled for d/c to [facility] today but she cannot void per nursing.  Admission papers have been signed with the facility but will delay d/c to SNF until medically stable per MD.  Notified Tammy at [facility]; pt's daughter is aware of above.  Lorri Frederick. [place]  [phone]   08/05/12  OK per MD for d/c today to [facility]. Met with patient's daughter who is pleased with d/c plan.  Discussed that patient required a safety sitter last night and facility is wiling to take patient today but if she becomes a fall risk- they will ask family to provide a sitter for patient.  Daughter verbalized understanding of this.  EMS to transport. Notified pt's nurse- Leah of d/c.  Lorri Frederick. [place]  [phone]

## 2012-08-02 NOTE — Progress Notes (Signed)
Received report from outgoing RN. Observed patient in bed sleeping. Family by bedside. Blood transfusion in progress. Will continue to monitor.

## 2012-08-02 NOTE — Progress Notes (Signed)
First unit of blood transfused without any complications at 18.10. Notified Dr. Rito Ehrlich. S regarding patient urinary retention, foley inserted as ordered. Lasix 20mg  IV x1 dose given post blood transfusion. Ambien 5mg  po at HS prn ordered by Dr. Rito Ehrlich. S, as per family request.

## 2012-08-02 NOTE — Clinical Social Work Note (Signed)
Clinical Social Work Department BRIEF PSYCHOSOCIAL ASSESSMENT 08/02/2012  Patient:  Morgan [name], Morgan [name]     Account Number:  [id number]     Admit date:  07/31/2012  Clinical Social Worker:  Skip Mayer  Date/Time:  08/02/2012 12:45 PM  Referred by:  Physician  Date Referred:  08/02/2012 Referred for  SNF Placement   Other Referral:   Interview type:  Family Other interview type:    PSYCHOSOCIAL DATA Living Status:  ALONE Admitted from facility:   Level of care:   Primary support name:  Bonita Quin [phone] Primary support relationship to patient:  CHILD, ADULT Degree of support available:   Strong support    CURRENT CONCERNS Current Concerns  Post-Acute Placement   Other Concerns:    SOCIAL WORK ASSESSMENT / PLAN CSW spoke via phone with pt's dtr, Bonita Quin, re: PT recommendation for SNF.  Pt admitted from home alone and pt's dtr provides assist as needed.  CSW explained placement process and answered questions.  Pt's dtr agreeable to [facility] search and reports [facility] is her preference.  CSW completed FL2 and initiated SNF search.  Weekday CSW to f/u with offers to pt's dtr.   Assessment/plan status:  Information/Referral to [facility] Other assessment/ plan:   Information/referral to community resources:   SNF  PTAR    PATIENT'S/FAMILY'S RESPONSE TO PLAN OF CARE: Pt's dtr reports agreeable to ST SNF stay for pt in order to increase pt's strength and independence with mobility prior to return home.  Pt's dtr verbalized understanding of placement process and appreciation for CSW assist.        Dellie Burns, MSW, LCSWA [phone] (Weekends 8:00am-4:30pm)

## 2012-08-02 NOTE — Progress Notes (Signed)
PATIENT ID: Morgan [name]   1 Day Post-Op Procedure(s) (LRB): ARTHROPLASTY BIPOLAR HIP (Left)  Subjective: Patient not orientated to respond appropriately to questions. Daughter in room and provided following information. Pain seems under control. Patient seems more confused than baseline, likely due to pain mgmt. Slept well overnight. Is eating some, but not well.   Objective:  Filed Vitals:   08/02/12 0653  BP: 142/93  Pulse: 120  Temp: 99.9 F (37.7 C)  Resp: 20     Awake, alert, does not respond to questions L hip dressing with small dried blood, intact Wiggles toes, compartments soft Calf soft Small superficial lacteration on L elbow from fall, cleaned and wrapped today  Labs:   [facility] 08/01/12 0505 07/31/12 1639  HGB 12.6 13.5   Basename 08/01/12 0505 07/31/12 1639  WBC 7.3 10.7*  RBC 4.10 4.44  HCT 36.5 39.5  PLT 185 227   Basename 08/01/12 0505 07/31/12 1639  NA 139 140  K 4.1 4.2  CL 104 102  CO2 26 28  BUN 14 16  CREATININE 0.69 0.72  GLUCOSE 105* 135*  CALCIUM 8.9 9.5    Assessment and Plan: 1 day s/p hemiarthroplasty L hip Chronic senile dementia, per daughter, now exacerbated Minimize narcotic medications, Norco as needed for breakthough pain, tylenol for maintaince Up with PT/OT today Dressing change tomorrow Appreciate hospitalist care  VTE proph: SCDs, ASA 325mg  BID

## 2012-08-03 ENCOUNTER — Inpatient Hospital Stay (HOSPITAL_COMMUNITY): Payer: Medicare Other

## 2012-08-03 DIAGNOSIS — S72009A Fracture of unspecified part of neck of unspecified femur, initial encounter for closed fracture: Secondary | ICD-10-CM

## 2012-08-03 DIAGNOSIS — W19XXXA Unspecified fall, initial encounter: Secondary | ICD-10-CM

## 2012-08-03 DIAGNOSIS — D62 Acute posthemorrhagic anemia: Secondary | ICD-10-CM

## 2012-08-03 LAB — CBC
HCT: 24.7 % — ABNORMAL LOW (ref 36.0–46.0)
MCH: 30.2 pg (ref 26.0–34.0)
MCV: 85.8 fL (ref 78.0–100.0)
Platelets: 156 10*3/uL (ref 150–400)
RBC: 2.88 MIL/uL — ABNORMAL LOW (ref 3.87–5.11)
WBC: 6.8 10*3/uL (ref 4.0–10.5)

## 2012-08-03 LAB — BASIC METABOLIC PANEL
BUN: 21 mg/dL (ref 6–23)
CO2: 26 mEq/L (ref 19–32)
Calcium: 8 mg/dL — ABNORMAL LOW (ref 8.4–10.5)
Chloride: 102 mEq/L (ref 96–112)
Creatinine, Ser: 0.95 mg/dL (ref 0.50–1.10)
Glucose, Bld: 109 mg/dL — ABNORMAL HIGH (ref 70–99)

## 2012-08-03 LAB — TYPE AND SCREEN

## 2012-08-03 MED ORDER — LORAZEPAM 2 MG/ML IJ SOLN
1.0000 mg | Freq: Two times a day (BID) | INTRAMUSCULAR | Status: DC | PRN
  Administered 2012-08-03 – 2012-08-05 (×2): 1 mg via INTRAVENOUS
  Filled 2012-08-03 (×2): qty 1

## 2012-08-03 MED ORDER — HYDROCODONE-ACETAMINOPHEN 5-325 MG PO TABS: 1.0000 | ORAL_TABLET | Freq: Four times a day (QID) | ORAL | Status: DC | PRN

## 2012-08-03 MED ORDER — METOPROLOL TARTRATE 25 MG PO TABS
25.0000 mg | ORAL_TABLET | Freq: Two times a day (BID) | ORAL | Status: DC
  Administered 2012-08-03 – 2012-08-05 (×4): 25 mg via ORAL
  Filled 2012-08-03 (×7): qty 1

## 2012-08-03 MED ORDER — ASPIRIN EC 325 MG PO TBEC: 325.0000 mg | DELAYED_RELEASE_TABLET | Freq: Two times a day (BID) | ORAL | Status: DC

## 2012-08-03 NOTE — Progress Notes (Signed)
PATIENT ID: Morgan [name]   2 Days Post-Op Procedure(s) (LRB): ARTHROPLASTY BIPOLAR HIP (Left)  Subjective: patient sleeping well and entered the room.  I was able to arouse her and although she was still not very responsive to questions. Spoke with nursing who stated that she is still having some trouble swallowing but has been taking some po medication.  Objective:  Filed Vitals:   08/03/12 0543  BP: 124/39  Pulse: 84  Temp: 98.6 F (37 C)  Resp: 20    Awake, alert, does not respond to questions appropriately Left hip dressing clean dry and intact Wiggles toes Knee immobilizer in place Good pulses distally   Labs:   Basename 08/03/12 0500 08/02/12 0855 08/01/12 0505 07/31/12 1639  HGB 8.7* 7.8* 12.6 13.5   Basename 08/03/12 0500 08/02/12 0855  WBC 6.8 7.3  RBC 2.88* 2.51*  HCT 24.7* 22.2*  PLT 156 156   Basename 08/03/12 0500 08/02/12 0855  NA 136 131*  K 4.1 4.5  CL 102 98  CO2 26 23  BUN 21 20  CREATININE 0.95 1.05  GLUCOSE 109* 113*  CALCIUM 8.0* 8.0*    Assessment and Plan: 2 days status post left hip hemiarthroplasty Pain seems to be well controlled, continue to keep narcotic medication at a minimum with by mouth Tylenol and Norco for breakthrough pain.  Only use IV morphine if absolutely necessary due to patient's inability to swallow Cont to work with PT Hgb to 8.7 after transfusion, up from yesterday  WBAT Scripts in chart for dc when medically stable   VTE proph: per primary team

## 2012-08-03 NOTE — Evaluation (Signed)
Occupational Therapy Evaluation Patient Details Name: Morgan [name] MRN: [id number] DOB: September 18, 1926 Today's Date: 08/03/2012 Time: 1010-1034 OT Time Calculation (min): 24 min  OT Assessment / Plan / Recommendation Clinical Impression  78 yo female s/p Lt  hip fracture, now s/p L Hip Hemiarthroplasty; Presents with decr functional moiblity; Will benefit from skilled OT acutely. Recommend SNF for d/c planning.    OT Assessment  Patient needs continued OT Services    Follow Up Recommendations  SNF    Barriers to Discharge Decreased caregiver support cognitive deficits limiting independence  Equipment Recommendations  Rolling walker with 5" wheels;3 in 1 bedside comode;Wheelchair (measurements);Wheelchair cushion (measurements)    Recommendations for Other Services    Frequency  Min 2X/week    Precautions / Restrictions Precautions Precautions: Posterior Hip;Fall Required Braces or Orthoses: Knee Immobilizer - Left Knee Immobilizer - Left:  (removed and skin checked without signs of break down) Restrictions Weight Bearing Restrictions: Yes LLE Weight Bearing: Weight bearing as tolerated   Pertinent Vitals/Pain Tolerated evaluation well Pt noted to have redness on Rt heel Pt positioned with bil LE heels floating to decrease pressure wounds    ADL  Toilet Transfer: +1 Total assistance Toilet Transfer Method: Sit to stand Toilet Transfer Equipment: Raised toilet seat with arms (or 3-in-1 over toilet) Equipment Used: Gait belt Transfers/Ambulation Related to ADLs: Pt stand pivot from EOB to Chair on the Right side. Pt assisting with Rt LE. pt extending LT LE. ADL Comments: Pt with Lt LE internally rotated in the bed on arrival with KI on. Pt naturally has a internal rotation to bil LE. Pt required total (A) to position LT LE correct positioning. Pt completed sit<.stand to chair. pt found to have void of bowel with transfer. Tech total (A) peri area hygiene while OT stood patient  at chair. Pt 0% (A) with hygiene. Pt positioned in chair with pillow between knees to maintain posterior THP. Pt picking and pulling at gown / sheets without purpose. Daughter reports patient with pain medication given just prior to OT arrival. Daughter reports patient more alert and with PO intake this AM.    OT Diagnosis: Generalized weakness;Cognitive deficits  OT Problem List: Decreased strength;Decreased activity tolerance;Decreased knowledge of use of DME or AE;Decreased knowledge of precautions OT Treatment Interventions: Self-care/ADL training;DME and/or AE instruction;Cognitive remediation/compensation;Therapeutic activities;Patient/family education;Balance training   OT Goals Acute Rehab OT Goals OT Goal Formulation: With patient/family Time For Goal Achievement: 08/17/12 Potential to Achieve Goals: Good ADL Goals Pt Will Perform Grooming: with min assist;Supported;Sitting, chair ADL Goal: Grooming - Progress: Goal set today Pt Will Perform Upper Body Bathing: with min assist;Supported;Sitting, chair ADL Goal: Upper Body Bathing - Progress: Goal set today Pt Will Perform Upper Body Dressing: with min assist;Supported;Sitting, chair ADL Goal: Upper Body Dressing - Progress: Goal set today Pt Will Transfer to Toilet: with mod assist;Stand pivot transfer;3-in-1 ADL Goal: Toilet Transfer - Progress: Goal set today Miscellaneous OT Goals Miscellaneous OT Goal #1: Pt will complete bed mobility Mod (A) maintaining LT THP at Mod (A) level as precursor to adls OT Goal: Miscellaneous Goal #1 - Progress: Goal set today  Visit Information  Last OT Received On: 08/03/12 Assistance Needed: +2    Subjective Data  Subjective: "hey"- pt only verbalized response Patient Stated Goal: pt unable to participate at this time   Prior Functioning     Home Living Lives With: Alone Available Help at Discharge: Personal care attendant;Available PRN/intermittently Type of Home: House Home  Access: Stairs to  enter Entrance Stairs-Number of Steps: 4 Entrance Stairs-Rails: None Home Layout: One level Home Adaptive Equipment: Walker - rolling Additional Comments: Per daughter, pt was able to manage her morning ADLs independently, with clothing laid out for her by helper the night before Prior Function Level of Independence: Independent with assistive device(s) Able to Take Stairs?: Yes Driving: No Communication Communication: Receptive difficulties;Expressive difficulties Dominant Hand: Right         Vision/Perception     Cognition  Overall Cognitive Status: History of cognitive impairments - further impaired Area of Impairment: Following commands;Other (comment);Safety/judgement;Awareness of deficits;Memory Arousal/Alertness: Awake/alert Orientation Level: Disoriented X4 Behavior During Session: Restless Memory: Decreased recall of precautions Following Commands: Follows one step commands inconsistently;Follows one step commands with increased time Safety/Judgement: Decreased awareness of safety precautions;Decreased safety judgement for tasks assessed Cognition - Other Comments: Pt has dementia but currently not at baseline    Extremity/Trunk Assessment Right Upper Extremity Assessment RUE ROM/Strength/Tone: [facility] for tasks assessed Left Upper Extremity Assessment LUE ROM/Strength/Tone: WFL for tasks assessed     Mobility Bed Mobility Supine to Sit: 1: +1 Total assist;HOB elevated Supine to Sit: Patient Percentage: 30% Sitting - Scoot to Edge of Bed: 1: +1 Total assist Sitting - Scoot to Edge of Bed: Patient Percentage: 10% Details for Bed Mobility Assistance: Pt sitting EOB min (A) for ~3 minutes. Pt tolerating transfer well. Transfers Sit to Stand: 2: Max assist;From bed Stand to Sit: 2: Max assist;To chair/3-in-1 Details for Transfer Assistance: pt sit,>stand with OT using gait belt only. pt activating Rt LE and extending LT LE without bearing weight. pt  required total (A) to facilitate turning to the chair.     Shoulder Instructions     Exercise     Balance     End of Session OT - End of Session Activity Tolerance: Patient tolerated treatment well Patient left: in chair;with call bell/phone within reach Nurse Communication: Mobility status;Precautions  GO     Lucile Shutters 08/03/2012, 10:50 AM Pager: [phone]

## 2012-08-03 NOTE — Progress Notes (Signed)
CARE MANAGEMENT NOTE 08/03/2012  Patient:  Morgan [name], Morgan [name]   Account Number:  [id number]  Date Initiated:  08/03/2012  Documentation initiated by:  Vance Peper  Subjective/Objective Assessment:   78 yr old female s/p left total hip arthroplasty.     Action/Plan:   patient is for shortterm SNF at [facility].Social worker is aware.   Anticipated DC Date:  08/03/2012   Anticipated DC Plan:  SKILLED NURSING FACILITY  In-house referral  Clinical Social Worker      DC Planning Services  CM consult      Choice offered to / List presented to:             Status of service:  Completed, signed off Medicare Important Message given?   (If response is "NO", the following Medicare IM given date fields will be blank) Date Medicare IM given:   Date Additional Medicare IM given:    Discharge Disposition:  SKILLED NURSING FACILITY  Per UR Regulation:    If discussed at Long Length of Stay Meetings, dates discussed:    Comments:

## 2012-08-03 NOTE — Progress Notes (Addendum)
Patient ID: Morgan [name], female   DOB: [birth year], 78 y.o.   MRN: [id number]  [facility] PROGRESS NOTE  Morgan [name] WUJ:[id number] DOB: November 21, 1926 DOA: 07/31/2012 PCP: Georgianne Fick, MD  Brief narrative: Pt is 78 y.o. female with past medical history of hypertension and advanced dementia who fell out of bed on witnessed today in her left. Her daughter lives close by and visit soft and found the patient early this morning. She is brought into the emergency room where she was found by films to have a left-sided hip fracture. Patient's blood pressure was also noted markedly elevated, which is unusual for her. She received IV Lopressor for her blood pressure. She remains still somewhat quite agitated. She denies that a fall or hip fracture ever happened. Her daughter states that she does get increased confusion when she is in the hospital. She was evaluated by orthopedic surgery and hip fracture repair is recommended. Hospitalists were called for further evaluation and admission.  Principal Problem:  *Hip fracture, left - status post repair post op day #2 - pt still rather confused but denies pain this AM - continue analgesia as needed - SNF likely in AM Active Problems:  Fever - post op day #2 - obtain CXR UA - pt has not voided over 10 hours and will have bladder scan done to rule out retention - BMP with stable creatinine  Senile dementia with delirium - still confused but not so much different from her baseline - if stable will plan d/c to SNF in AM  HTN (hypertension), malignant - still above the target  - will change metoprolol to PO scheduled BID and will hold for SBP < 90 mmHg  Fall - PT recommendation is for SNF placement - SW on board for assistance   Acute blood loss anemia - post op related - improved since yesterday - CBC In AM  Consultants:  [facility]-orthopedic surgery Procedures:  Hip repair post op day #2 Dg Pelvis Portable 08/01/2012 --> Status  post left hip hemiarthroplasty without apparent complication.    Antibiotics:  None  Code Status: Full Family Communication: Daughter at bedside Disposition Plan: SNF in AM likely if stable  HPI/Subjective: No events overnight. Pt denies pain this AM>  Objective: Filed Vitals:   08/02/12 2230 08/02/12 2330 08/03/12 0005 08/03/12 0543  BP: 154/60 145/50 140/43 124/39  Pulse: 110 106 96 84  Temp: 99.9 F (37.7 C) 100 F (37.8 C) 98.1 F (36.7 C) 98.6 F (37 C)  TempSrc: Axillary Axillary Axillary Axillary  Resp: 18 20 20 20   Height:      Weight:      SpO2:   96% 96%    Intake/Output Summary (Last 24 hours) at 08/03/12 1346 Last data filed at 08/03/12 0700  Gross per 24 hour  Intake   1070 ml  Output   1100 ml  Net    -30 ml    Exam:   General:  Pt is alert, follows some commands appropriately, not in acute distress  Cardiovascular: Regular rhythm, tachycardic, S1/S2, no murmurs, no rubs, no gallops  Respiratory: Clear to auscultation bilaterally, decreased breath sounds at bases  Abdomen: Soft, non tender, non distended, bowel sounds present, no guarding  Extremities: No edema, pulses DP and PT palpable bilaterally  Neuro: Grossly nonfocal, pt oriented to name only  Data Reviewed: Basic Metabolic Panel:  Lab 08/03/12 9562 08/02/12 0855 08/01/12 0505 07/31/12 1639  NA 136 131* 139 140  K 4.1 4.5 4.1 4.2  CL 102 98 104 102  CO2 26 23 26 28   GLUCOSE 109* 113* 105* 135*  BUN 21 20 14 16   CREATININE 0.95 1.05 0.69 0.72  CALCIUM 8.0* 8.0* 8.9 9.5  MG -- -- -- --  PHOS -- -- -- --   CBC:  Lab 08/03/12 0500 08/02/12 0855 08/01/12 0505 07/31/12 1639  WBC 6.8 7.3 7.3 10.7*  NEUTROABS -- -- -- 9.9*  HGB 8.7* 7.8* 12.6 13.5  HCT 24.7* 22.2* 36.5 39.5  MCV 85.8 88.4 89.0 89.0  PLT 156 156 185 227    Recent Results (from the past 240 hour(s))  SURGICAL PCR SCREEN     Status: Normal   Collection Time   08/01/12  4:23 AM      Component Value Range Status  Comment   MRSA, PCR NEGATIVE  NEGATIVE Final    Staphylococcus aureus NEGATIVE  NEGATIVE Final      Scheduled Meds:   . aspirin EC  325 mg Oral BID  . docusate sodium  100 mg Oral BID  . [COMPLETED] furosemide  20 mg Intravenous Once  . metoprolol  5 mg Intravenous Q6H   Continuous Infusions:   . 0.45 % NaCl with KCl 20 mEq / L 100 mL/hr at 08/01/12 1042  . sodium chloride 75 mL (08/02/12 0245)     [name], MD  [facility] Pager [phone]  If 7PM-7AM, please contact night-coverage [email or web address] Password [email or web address] 08/03/2012, 1:46 PM   LOS: 3 days

## 2012-08-04 ENCOUNTER — Encounter (HOSPITAL_COMMUNITY): Payer: Self-pay | Admitting: Orthopedic Surgery

## 2012-08-04 LAB — BASIC METABOLIC PANEL
CO2: 21 mEq/L (ref 19–32)
Glucose, Bld: 75 mg/dL (ref 70–99)
Potassium: 4.1 mEq/L (ref 3.5–5.1)
Sodium: 137 mEq/L (ref 135–145)

## 2012-08-04 LAB — CBC
Hemoglobin: 8.1 g/dL — ABNORMAL LOW (ref 12.0–15.0)
RBC: 2.64 MIL/uL — ABNORMAL LOW (ref 3.87–5.11)
WBC: 4.6 10*3/uL (ref 4.0–10.5)

## 2012-08-04 MED ORDER — HYDROCODONE-ACETAMINOPHEN 5-325 MG PO TABS: 1.0000 | ORAL_TABLET | Freq: Four times a day (QID) | ORAL | Status: DC | PRN

## 2012-08-04 MED ORDER — DSS 100 MG PO CAPS: 100.0000 mg | ORAL_CAPSULE | Freq: Two times a day (BID) | ORAL | Status: DC | PRN

## 2012-08-04 MED ORDER — ASPIRIN EC 325 MG PO TBEC: 325.0000 mg | DELAYED_RELEASE_TABLET | Freq: Two times a day (BID) | ORAL | Status: DC

## 2012-08-04 NOTE — Progress Notes (Signed)
Physical Therapy Treatment Patient Details Name: Morgan [name] MRN: [id number] DOB: [birth year] Today's Date: 08/04/2012 Time: 2956-2130 PT Time Calculation (min): 19 min  PT Assessment / Plan / Recommendation Comments on Treatment Session  Patient being cleaned up in bed when entering room. Patient transferred to chair with cueing and +2 assistance. Patient with limited participation due to cognition. RN and tech made aware of transfer. Patient possibly being dishcarged to SNF this afternoon    Follow Up Recommendations  SNF     Does the patient have the potential to tolerate intense rehabilitation     Barriers to Discharge        Equipment Recommendations  Rolling walker with 5" wheels;3 in 1 bedside comode;Wheelchair cushion (measurements);Wheelchair (measurements)    Recommendations for Other Services    Frequency Min 3X/week   Plan Discharge plan remains appropriate;Frequency remains appropriate    Precautions / Restrictions Precautions Precautions: Posterior Hip;Fall Required Braces or Orthoses: Knee Immobilizer - Left Restrictions LLE Weight Bearing: Weight bearing as tolerated   Pertinent Vitals/Pain     Mobility  Bed Mobility Supine to Sit: 2: Max assist Supine to Sit: Patient Percentage: 30% Sitting - Scoot to Edge of Bed: 1: +1 Total assist Sitting - Scoot to Edge of Bed: Patient Percentage: 0% Details for Bed Mobility Assistance: A with all aspects of transfer to EOB. Cueing provided throughout for sequency and positioning Transfers Sit to Stand: 1: +2 Total assist;From bed;From chair/3-in-1 Stand to Sit: 1: +2 Total assist;To chair/3-in-1 Stand Pivot Transfers: 1: +2 Total assist Stand Pivot Transfers: Patient Percentage: 20% Details for Transfer Assistance: A required for initiation, balance, stability throughout transfer. Patient not putting weight through L LE. Cues throughout.  Ambulation/Gait Ambulation/Gait Assistance: Not tested (comment)      Exercises     PT Diagnosis:    PT Problem List:   PT Treatment Interventions:     PT Goals Acute Rehab PT Goals PT Goal: Supine/Side to Sit - Progress: Progressing toward goal PT Goal: Sit to Stand - Progress: Progressing toward goal PT Goal: Stand to Sit - Progress: Progressing toward goal PT Transfer Goal: Bed to Chair/Chair to Bed - Progress: Progressing toward goal  Visit Information  Last PT Received On: 08/04/12 Assistance Needed: +2    Subjective Data      Cognition  Overall Cognitive Status: History of cognitive impairments - further impaired Area of Impairment: Attention;Following commands;Safety/judgement;Memory Arousal/Alertness: Awake/alert Orientation Level: Disoriented X4 Behavior During Session: Anxious Following Commands: Follows one step commands inconsistently Safety/Judgement: Decreased safety judgement for tasks assessed;Decreased awareness of safety precautions Cognition - Other Comments: Patient has dementia but currently not at baseline    Balance     End of Session PT - End of Session Equipment Utilized During Treatment: Gait belt Activity Tolerance: Patient tolerated treatment well;Treatment limited secondary to medical complications (Comment) (cognition) Patient left: in chair;with call bell/phone within reach Nurse Communication: Mobility status   GP     Fredrich Birks 08/04/2012, 12:04 PM  08/04/2012 Fredrich Birks PTA [phone] pager [phone] office

## 2012-08-04 NOTE — Discharge Summary (Signed)
Physician Discharge Summary  Morgan [name]:[id number] DOB: Apr 01, 1927 DOA: 07/31/2012  PCP: Georgianne Fick, MD  Admit date: 07/31/2012 Discharge date: 08/04/2012  Recommendations for Outpatient Follow-up:  1. Pt will need to follow up with PCP in 2-3 weeks post discharge 2. Please obtain BMP to evaluate electrolytes and kidney function 3. Please also check CBC to evaluate Hg and Hct levels 4. Please note that Aspirin 325 mg BID given for DVT prophylaxis and should be discontinued in 2 weeks  Discharge Diagnoses: Left hip fracture, status post repair  Principal Problem:  *Hip fracture, left Active Problems:  Senile dementia with delirium  HTN (hypertension), malignant  Fall  Acute blood loss anemia  Discharge Condition: Stable  Diet recommendation: Heart healthy diet discussed in details   Brief narrative:  Pt is 78 y.o. female with past medical history of hypertension and advanced dementia who fell out of bed on witnessed today in her left. Her daughter lives close by and visit soft and found the patient early this morning. She is brought into the emergency room where she was found by films to have a left-sided hip fracture. Patient's blood pressure was also noted markedly elevated, which is unusual for her. She received IV Lopressor for her blood pressure. She remains still somewhat quite agitated. She denies that a fall or hip fracture ever happened. Her daughter states that she does get increased confusion when she is in the hospital. She was evaluated by orthopedic surgery and hip fracture repair is recommended. Hospitalists were called for further evaluation and admission.   Principal Problem:  *Hip fracture, left  - status post repair post op day #3 - pt much better this AM, more alert - continue analgesia as needed  Active Problems:  Fever  - now resolved - obtain ed CXR UA and both unremarkable - BMP with stable creatinine  Senile dementia with delirium  -  improved since yesterday HTN (hypertension), malignant  - rate controlled this AM Fall  - PT recommendation is for SNF placement  - SW on board for assistance  Acute blood loss anemia  - post op related   Consultants:  [facility]-orthopedic surgery Procedures:  Hip repair post op day #2  Dg Pelvis Portable 08/01/2012 --> Status post left hip hemiarthroplasty without apparent complication.  Antibiotics:  None  Code Status: Full  Family Communication: Daughter at bedside   Discharge Exam: Filed Vitals:   08/04/12 0600  BP: 152/60  Pulse: 95  Temp: 99 F (37.2 C)  Resp: 20   Filed Vitals:   08/03/12 0543 08/03/12 1400 08/03/12 2125 08/04/12 0600  BP: 124/39 144/63 158/48 152/60  Pulse: 84 94 92 95  Temp: 98.6 F (37 C) 98.4 F (36.9 C) 98.3 F (36.8 C) 99 F (37.2 C)  TempSrc: Axillary   Oral  Resp: 20 16 18 20   Height:      Weight:      SpO2: 96%  94% 97%    General: Pt is alert, follows commands appropriately, not in acute distress Cardiovascular: Regular rate and rhythm, S1/S2 +, no murmurs, no rubs, no gallops Respiratory: Clear to auscultation bilaterally, no wheezing, no crackles, no rhonchi Abdominal: Soft, non tender, non distended, bowel sounds +, no guarding Extremities: no edema, no cyanosis, pulses palpable bilaterally DP and PT Neuro: Grossly nonfocal, oriented to name only  Discharge Instructions  Discharge Orders    Future Orders Please Complete By Expires   Diet - low sodium heart healthy  Weight bearing as tolerated      Increase activity slowly          Medication List     As of 08/04/2012 11:12 AM    TAKE these medications         amLODipine 5 MG tablet   Commonly known as: NORVASC   Take 5 mg by mouth daily.      aspirin EC 325 MG tablet   Take 1 tablet (325 mg total) by mouth 2 (two) times daily.      DSS 100 MG Caps   Take 100 mg by mouth 2 (two) times daily as needed for constipation.      HYDROcodone-acetaminophen  5-325 MG per tablet   Commonly known as: NORCO/VICODIN   Take 1 tablet by mouth every 6 (six) hours as needed (for breakthough pain).           Follow-up Information    Follow up with Mable Paris, MD. In 2 weeks.   Contact information:   [address] [place] [address] [phone]       Follow up with [facility], MD. In 2 weeks.   Contact information:   [address] [place] [address] [phone]           The results of significant diagnostics from this hospitalization (including imaging, microbiology, ancillary and laboratory) are listed below for reference.     Microbiology: Recent Results (from the past 240 hour(s))  SURGICAL PCR SCREEN     Status: Normal   Collection Time   08/01/12  4:23 AM      Component Value Range Status Comment   MRSA, PCR NEGATIVE  NEGATIVE Final    Staphylococcus aureus NEGATIVE  NEGATIVE Final      Labs: Basic Metabolic Panel:  Lab 08/04/12 6295 08/03/12 0500 08/02/12 0855 08/01/12 0505 07/31/12 1639  NA 137 136 131* 139 140  K 4.1 4.1 4.5 4.1 4.2  CL 103 102 98 104 102  CO2 21 26 23 26 28   GLUCOSE 75 109* 113* 105* 135*  BUN 22 21 20 14 16   CREATININE 0.79 0.95 1.05 0.69 0.72  CALCIUM 8.2* 8.0* 8.0* 8.9 9.5  MG -- -- -- -- --  PHOS -- -- -- -- --   CBC:  Lab 08/04/12 0615 08/03/12 0500 08/02/12 0855 08/01/12 0505 07/31/12 1639  WBC 4.6 6.8 7.3 7.3 10.7*  NEUTROABS -- -- -- -- 9.9*  HGB 8.1* 8.7* 7.8* 12.6 13.5  HCT 23.1* 24.7* 22.2* 36.5 39.5  MCV 87.5 85.8 88.4 89.0 89.0  PLT 169 156 156 185 227    SIGNED: Time coordinating discharge: Over 30 minutes  [name], MD  [facility] 08/04/2012, 11:12 AM Pager [phone]  If 7PM-7AM, please contact night-coverage [email or web address] Password [email or web address]

## 2012-08-04 NOTE — Progress Notes (Signed)
PATIENT ID: Morgan [name]   3 Days Post-Op Procedure(s) (LRB): ARTHROPLASTY BIPOLAR HIP (Left)  Subjective: A little more alert today. No complaints of pain.   Objective:  Filed Vitals:   08/04/12 0600  BP: 152/60  Pulse: 95  Temp: 99 F (37.2 C)  Resp: 20     Awake, alert, somewhat responsive to questions L LE dressing c/d/i Wiggles toes  Knee immobilizer in place  Good pulses distally No calf tenderness  Labs:   Basename 08/04/12 0615 08/03/12 0500 08/02/12 0855  HGB 8.1* 8.7* 7.8*   Basename 08/04/12 0615 08/03/12 0500  WBC 4.6 6.8  RBC 2.64* 2.88*  HCT 23.1* 24.7*  PLT 169 156   Basename 08/04/12 0615 08/03/12 0500  NA 137 136  K 4.1 4.1  CL 103 102  CO2 21 26  BUN 22 21  CREATININE 0.79 0.95  GLUCOSE 75 109*  CALCIUM 8.2* 8.0*    Assessment and Plan: Okay to dc from ortho standpoint when medically stable Dc to SNF Continue to keep medication at a minimum Scripts in chart, should use Tylenol for pain control, Norco for prn breakthrough pain. ASA 325mg  twice daily for 2 weeks  WBAT  VTE proph: per primary team

## 2012-08-05 ENCOUNTER — Encounter (HOSPITAL_COMMUNITY): Payer: Self-pay | Admitting: General Practice

## 2012-08-05 DIAGNOSIS — R339 Retention of urine, unspecified: Secondary | ICD-10-CM

## 2012-08-05 MED ORDER — ASPIRIN EC 325 MG PO TBEC: 325.0000 mg | DELAYED_RELEASE_TABLET | Freq: Two times a day (BID) | ORAL | Status: DC

## 2012-08-05 NOTE — Progress Notes (Signed)
Called report to [employer] to [place], LPN. Also, discussed discharge instructions with pt and daughter including how and when to call the dr, medications, diet, activity, and follow up appts. Pt and daughter verbalized understanding. IV dc'd. Awaiting transportation via EMS. Will continue to monitor pt until EMS arrives.  Diamantina Providence, RN

## 2012-08-05 NOTE — Progress Notes (Signed)
Pt still not able to void on her own, at 0130, bladder scan was done with results of 161. Encouraged pt to drink more fluid. At 0530 another bladder scan done with results of 246. In and out cath done with output of 325 cc. Will cont to monitor.

## 2012-08-05 NOTE — Progress Notes (Signed)
Physical Therapy Treatment Patient Details Name: Morgan [name] MRN: [id number] DOB: 04/22/1927 Today's Date: 08/05/2012 Time: 4098-1191 PT Time Calculation (min): 24 min  PT Assessment / Plan / Recommendation Comments on Treatment Session  Patient more alert and could answer question, however most incorrectly. She was able to sit EOB without assistance prior to transferring. Awaiting SNF at this time    Follow Up Recommendations  SNF     Does the patient have the potential to tolerate intense rehabilitation     Barriers to Discharge        Equipment Recommendations  Rolling walker with 5" wheels;3 in 1 bedside comode;Wheelchair cushion (measurements);Wheelchair (measurements)    Recommendations for Other Services    Frequency Min 3X/week   Plan Discharge plan remains appropriate;Frequency remains appropriate    Precautions / Restrictions Precautions Precautions: Posterior Hip;Fall Required Braces or Orthoses: Knee Immobilizer - Left Restrictions LLE Weight Bearing: Weight bearing as tolerated   Pertinent Vitals/Pain     Mobility  Bed Mobility Supine to Sit: 1: +1 Total assist Supine to Sit: Patient Percentage: 20% Sitting - Scoot to Edge of Bed: 1: +1 Total assist Sitting - Scoot to Edge of Bed: Patient Percentage: 20% Details for Bed Mobility Assistance: A with all aspects, cueing throughout Transfers Sit to Stand: 1: +2 Total assist;From bed Sit to Stand: Patient Percentage: 30% Stand to Sit: 1: +2 Total assist;To chair/3-in-1 Stand Pivot Transfers: 1: +2 Total assist Stand Pivot Transfers: Patient Percentage: 30% Details for Transfer Assistance: A throughout with initiation and with positioning and balance. Patient unable to pivot requiring A for hips towards recliner.     Exercises     PT Diagnosis:    PT Problem List:   PT Treatment Interventions:     PT Goals Acute Rehab PT Goals PT Goal: Supine/Side to Sit - Progress: Progressing toward goal PT  Goal: Sit to Stand - Progress: Progressing toward goal PT Goal: Stand to Sit - Progress: Progressing toward goal PT Transfer Goal: Bed to Chair/Chair to Bed - Progress: Progressing toward goal  Visit Information  Last PT Received On: 08/05/12 Assistance Needed: +1    Subjective Data      Cognition  Overall Cognitive Status: History of cognitive impairments - further impaired Area of Impairment: Attention;Memory;Following commands;Awareness of deficits;Problem solving Arousal/Alertness: Awake/alert Orientation Level: Disoriented X4 Behavior During Session: Anxious Memory: Decreased recall of precautions Following Commands: Follows one step commands inconsistently Safety/Judgement: Decreased safety judgement for tasks assessed;Decreased awareness of need for assistance    Balance     End of Session PT - End of Session Equipment Utilized During Treatment: Gait belt Activity Tolerance: Patient tolerated treatment well;Treatment limited secondary to medical complications (Comment) Patient left: in chair;with call bell/phone within reach;with family/visitor present Nurse Communication: Mobility status   GP     Fredrich Birks 08/05/2012, 12:02 PM 08/05/2012 Fredrich Birks PTA [phone] pager [phone] office

## 2012-08-05 NOTE — Progress Notes (Signed)
PATIENT ID: Morgan [name]   4 Days Post-Op Procedure(s) (LRB): ARTHROPLASTY BIPOLAR HIP (Left)  Subjective: patient is resting comfortably in bed. No complaints of pain.  Objective:  Filed Vitals:   08/05/12 0558  BP: 163/59  Pulse: 78  Temp: 98 F (36.7 C)  Resp: 16    Awake, alert, somewhat responsive to questions  L LE dressing changed today Wiggles toes  Knee immobilizer in place  Good pulses distally  No calf tenderness    Labs:   Basename 08/04/12 0615 08/03/12 0500  HGB 8.1* 8.7*   Basename 08/04/12 0615 08/03/12 0500  WBC 4.6 6.8  RBC 2.64* 2.88*  HCT 23.1* 24.7*  PLT 169 156   Basename 08/04/12 0615 08/03/12 0500  NA 137 136  K 4.1 4.1  CL 103 102  CO2 21 26  BUN 22 21  CREATININE 0.79 0.95  GLUCOSE 75 109*  CALCIUM 8.2* 8.0*    Assessment and Plan: Okay to dc from ortho standpoint when medically stable. Seems like medicine is working on the discharge. Dc to SNF  Continue to keep medication at a minimum  Scripts in chart, should use Tylenol for pain control, Norco for prn breakthrough pain. ASA 325mg  twice daily for 2 weeks  WBAT Appreciate medicine's care followup with Dr. Ave Filter in 10 days   VTE proph: aspirin and SCDs

## 2012-08-05 NOTE — Progress Notes (Signed)
Patient ID: Morgan [name], female   DOB: [birth year], 78 y.o.   MRN: [id number]  [facility] PROGRESS NOTE  Morgan [name] [id number]:[id number] DOB: 1927/01/23 DOA: 07/31/2012 PCP: Georgianne Fick, MD  Brief narrative: Pt is 78 y.o. female with past medical history of hypertension and advanced dementia who fell out of bed on witnessed today in her left. Her daughter lives close by and visit soft and found the patient early this morning. She is brought into the emergency room where she was found by films to have a left-sided hip fracture. Patient's blood pressure was also noted markedly elevated, which is unusual for her. She received IV Lopressor for her blood pressure. She remains still somewhat quite agitated. She denies that a fall or hip fracture ever happened. Her daughter states that she does get increased confusion when she is in the hospital. She was evaluated by orthopedic surgery and hip fracture repair is recommended. Hospitalists were called for further evaluation and admission.  Principal Problem: Hip fracture, left Status post repair post op day #4. Patient still slightly confused but improving per daughter, sitter discontinued his AM. Continue analgesia as needed. SNF likely in AM.  To followup with ortho at discharge.   Fever Resolved. Infectious etiology negative.  Urinary retention Required I/O cath initially.  Per nursing staff patient waited today 125 cc a bladder scan showed 24 mL remaining, do not see the need for Foley catheter.  May need I&O catheter versus Foley catheter at discharge.  Senile dementia with delirium Still confused, but likely is at her baseline.  HTN (hypertension) Stable, elevated at times.  Continue metoprolol at current dose.  Fall PT recommendation is for SNF placement. SW on board for assistance.  Acute blood loss anemia Post op related.  Consultants:  [facility]-orthopedic surgery Procedures:  Hip repair post op day #3 Dg Pelvis  Portable 08/01/2012 --> Status post left hip hemiarthroplasty without apparent complication.    Antibiotics:  None  Code Status: Full Family Communication: Daughter at bedside Disposition Plan: SNF today.   HPI/Subjective: Patient pleasant, but slightly confused. Denied any pain.  Objective: Filed Vitals:   08/04/12 1300 08/04/12 2130 08/05/12 0558 08/05/12 1044  BP: 169/48 160/58 163/59 129/53  Pulse: 82 97 78 89  Temp: 98.3 F (36.8 C) 98.5 F (36.9 C) 98 F (36.7 C)   TempSrc:      Resp: 18 16 16    Height:      Weight:      SpO2: 97% 100% 99%     Intake/Output Summary (Last 24 hours) at 08/05/12 1050 Last data filed at 08/04/12 1804  Gross per 24 hour  Intake      0 ml  Output    400 ml  Net   -400 ml    Exam: Physical Exam: General: Awake, Oriented to self, No acute distress. HEENT: EOMI. Neck: Supple CV: S1 and S2 Lungs: Clear to ascultation bilaterally Abdomen: Soft, Nontender, Nondistended, +bowel sounds. Ext: Good pulses. Trace edema.  Data Reviewed: Basic Metabolic Panel:  Lab 08/04/12 5956 08/03/12 0500 08/02/12 0855 08/01/12 0505 07/31/12 1639  NA 137 136 131* 139 140  K 4.1 4.1 4.5 4.1 4.2  CL 103 102 98 104 102  CO2 21 26 23 26 28   GLUCOSE 75 109* 113* 105* 135*  BUN 22 21 20 14 16   CREATININE 0.79 0.95 1.05 0.69 0.72  CALCIUM 8.2* 8.0* 8.0* 8.9 9.5  MG -- -- -- -- --  PHOS -- -- -- -- --  CBC:  Lab 08/04/12 0615 08/03/12 0500 08/02/12 0855 08/01/12 0505 07/31/12 1639  WBC 4.6 6.8 7.3 7.3 10.7*  NEUTROABS -- -- -- -- 9.9*  HGB 8.1* 8.7* 7.8* 12.6 13.5  HCT 23.1* 24.7* 22.2* 36.5 39.5  MCV 87.5 85.8 88.4 89.0 89.0  PLT 169 156 156 185 227    Recent Results (from the past 240 hour(s))  SURGICAL PCR SCREEN     Status: Normal   Collection Time   08/01/12  4:23 AM      Component Value Range Status Comment   MRSA, PCR NEGATIVE  NEGATIVE Final    Staphylococcus aureus NEGATIVE  NEGATIVE Final      Scheduled Meds:    . aspirin  EC  325 mg Oral BID  . docusate sodium  100 mg Oral BID  . metoprolol tartrate  25 mg Oral BID   Continuous Infusions:    . [DISCONTINUED] sodium chloride 75 mL (08/02/12 0245)     Sada Mazzoni A, MD  [facility] Pager [phone]  If 7PM-7AM, please contact night-coverage [email or web address] Password [email or web address] 08/05/2012, 10:50 AM   LOS: 5 days

## 2013-01-05 ENCOUNTER — Other Ambulatory Visit: Payer: Self-pay | Admitting: *Deleted

## 2013-01-05 MED ORDER — LORAZEPAM 0.5 MG PO TABS: ORAL_TABLET | ORAL | Status: DC

## 2013-01-08 DIAGNOSIS — E46 Unspecified protein-calorie malnutrition: Secondary | ICD-10-CM

## 2013-01-08 DIAGNOSIS — F339 Major depressive disorder, recurrent, unspecified: Secondary | ICD-10-CM

## 2013-01-08 DIAGNOSIS — M810 Age-related osteoporosis without current pathological fracture: Secondary | ICD-10-CM

## 2013-01-08 DIAGNOSIS — N182 Chronic kidney disease, stage 2 (mild): Secondary | ICD-10-CM

## 2013-01-08 DIAGNOSIS — I1310 Hypertensive heart and chronic kidney disease without heart failure, with stage 1 through stage 4 chronic kidney disease, or unspecified chronic kidney disease: Secondary | ICD-10-CM

## 2013-01-08 DIAGNOSIS — I739 Peripheral vascular disease, unspecified: Secondary | ICD-10-CM

## 2013-01-08 DIAGNOSIS — F0390 Unspecified dementia without behavioral disturbance: Secondary | ICD-10-CM

## 2013-01-08 DIAGNOSIS — D649 Anemia, unspecified: Secondary | ICD-10-CM

## 2013-03-10 ENCOUNTER — Other Ambulatory Visit: Payer: Self-pay | Admitting: *Deleted

## 2013-03-10 MED ORDER — LORAZEPAM 0.5 MG PO TABS: ORAL_TABLET | ORAL | Status: DC

## 2013-03-16 ENCOUNTER — Encounter: Payer: Self-pay | Admitting: *Deleted

## 2013-03-18 ENCOUNTER — Encounter: Payer: Self-pay | Admitting: *Deleted

## 2013-04-02 ENCOUNTER — Non-Acute Institutional Stay (SKILLED_NURSING_FACILITY): Payer: PRIVATE HEALTH INSURANCE | Admitting: Internal Medicine

## 2013-04-02 DIAGNOSIS — F03918 Unspecified dementia, unspecified severity, with other behavioral disturbance: Secondary | ICD-10-CM

## 2013-04-02 DIAGNOSIS — F329 Major depressive disorder, single episode, unspecified: Secondary | ICD-10-CM | POA: Insufficient documentation

## 2013-04-02 DIAGNOSIS — I10 Essential (primary) hypertension: Secondary | ICD-10-CM | POA: Insufficient documentation

## 2013-04-02 DIAGNOSIS — K5900 Constipation, unspecified: Secondary | ICD-10-CM | POA: Insufficient documentation

## 2013-04-02 DIAGNOSIS — F0390 Unspecified dementia without behavioral disturbance: Secondary | ICD-10-CM | POA: Insufficient documentation

## 2013-04-02 DIAGNOSIS — M1990 Unspecified osteoarthritis, unspecified site: Secondary | ICD-10-CM

## 2013-04-02 DIAGNOSIS — F0391 Unspecified dementia with behavioral disturbance: Secondary | ICD-10-CM

## 2013-04-02 DIAGNOSIS — F32A Depression, unspecified: Secondary | ICD-10-CM

## 2013-04-02 DIAGNOSIS — M810 Age-related osteoporosis without current pathological fracture: Secondary | ICD-10-CM | POA: Insufficient documentation

## 2013-04-02 DIAGNOSIS — R269 Unspecified abnormalities of gait and mobility: Secondary | ICD-10-CM | POA: Insufficient documentation

## 2013-04-02 DIAGNOSIS — S72009D Fracture of unspecified part of neck of unspecified femur, subsequent encounter for closed fracture with routine healing: Secondary | ICD-10-CM | POA: Insufficient documentation

## 2013-04-02 DIAGNOSIS — D509 Iron deficiency anemia, unspecified: Secondary | ICD-10-CM

## 2013-04-02 DIAGNOSIS — I999 Unspecified disorder of circulatory system: Secondary | ICD-10-CM | POA: Insufficient documentation

## 2013-04-02 NOTE — Progress Notes (Signed)
Patient ID: AZKA STEGER, female   DOB: 10-23-1926, 79 y.o.   MRN: [id number]  [facility]  Code Status: full code  Allergies  Allergen Reactions  . Codeine Nausea And Vomiting   Chief Complaint  Patient presents with  . Medical Managment of Chronic Issues    HPI:  79 y/o female patient is here for long term care. She was seen in her room on her wheelchair. No acute behavioral changes. No new concerns from staff. No falls, trauma, wounds or weight loss reported. She has confusion at baseline and unable to obtain ROS form her Reviewed her medications and labs Reviewed bp readings with systolic mostly 140-168  Past Medical History  Diagnosis Date  . Hypertension   . Dementia   . Chronic kidney disease   . Cancer     HX OF BREAST CANCER  . Arthritis   . Macular degeneration   . Anxiety state, unspecified   . Unspecified constipation   . Osteoporosis, unspecified   . Abnormality of gait   . Fracture of neck of femur   . Peripheral vascular complications   . Personal history of fall   . Aftercare for healing traumatic fracture of hip   . Long term (current) use of anticoagulants   . Anemia   . Depression    Past Surgical History  Procedure Laterality Date  . Lumpectormy    . Hip arthroplasty  08/01/2012    Procedure: ARTHROPLASTY BIPOLAR HIP;  Surgeon: Mable Paris, MD;  Location: [facility] OR;  Service: Orthopedics;  Laterality: Left;  Surgeon requests to follow 1st case.  . Abdominal hysterectomy    . Fracture surgery Left 07/2012    hip   Social History:   reports that she has never smoked. She has never used smokeless tobacco. She reports that she does not drink alcohol or use illicit drugs.  No family history on file.  Medications: Patient's Medications  New Prescriptions   No medications on file  Previous Medications   ACETAMINOPHEN (TYLENOL) 325 MG TABLET    Take 650 mg by mouth every 8 (eight) hours.   AMLODIPINE (NORVASC) 5  MG TABLET    Take 5 mg by mouth daily. Take 1 tablet daily for BP control.   CALCIUM-VITAMIN D (OSCAL WITH D) 500-200 MG-UNIT PER TABLET    Take 1 tablet by mouth daily with breakfast.   CHOLECALCIFEROL (VITAMIN D3) 2000 UNITS TABS    Take 1 capsule by mouth daily.   CITALOPRAM (CELEXA) 20 MG TABLET    Take 20 mg by mouth daily.   CLONIDINE (CATAPRES - DOSED IN MG/24 HR) 0.1 MG/24HR PATCH    Place 1 patch onto the skin daily as needed.   DOCUSATE SODIUM 100 MG CAPS    Take 100 mg by mouth 2 (two) times daily as needed for constipation.   FERROUS SULFATE 325 (65 FE) MG TABLET    Take 325 mg by mouth daily with breakfast.   SENNOSIDES-DOCUSATE SODIUM (SENOKOT-S) 8.6-50 MG TABLET    Take 1 tablet by mouth daily.  Modified Medications   Modified Medication Previous Medication   ASPIRIN EC 325 MG TABLET aspirin EC 325 MG tablet      Take 81 mg by mouth daily. Twice daily for two weeks.    Take 1 tablet (325 mg total) by mouth 2 (two) times daily. Twice daily for two weeks.   LORAZEPAM (ATIVAN) 0.5 MG TABLET LORazepam (ATIVAN) 0.5 MG tablet  Take 0.25 mg by mouth 2 (two) times daily. Take 1/2 tablet =0.25MG  by mouth twice a day  for anxiety, may take an additional 1/2 tablet as needed for agitation.    Take 1/2 tablet =0.25MG  by mouth twice a day  for anxiety, may take an additional 1/2 tablet as needed for agitation.  Discontinued Medications   HYDROCODONE-ACETAMINOPHEN (NORCO) 5-325 MG PER TABLET    Take 1 tablet by mouth every 6 (six) hours as needed (for breakthough pain).     Filed Vitals:   04/02/13 1537  BP: 162/60  Pulse: 71  Temp: 97.8 F (36.6 C)  Resp: 16  Weight: 124 lb 12.8 oz (56.609 kg)  SpO2: 96%   Physical Exam  Constitutional: She appears well-developed. No distress.  HENT:  Head: Normocephalic.  Mouth/Throat: Oropharynx is clear and moist.  Eyes: Conjunctivae are normal. Pupils are equal, round, and reactive to light.  Neck: Normal range of motion. Neck supple.   Cardiovascular: Normal rate, regular rhythm, normal heart sounds and intact distal pulses.   No murmur heard. Pulmonary/Chest: Effort normal and breath sounds normal.  Abdominal: Soft. Bowel sounds are normal. She exhibits no distension and no mass. There is no rebound.  Musculoskeletal: Normal range of motion. She exhibits no edema and no tenderness.  Able to move all 4, propelled in wheelchair by others, fall risk  Skin: Skin is warm and dry. She is not diaphoretic.  Psychiatric:  Poor insight, poor judgement, dementia    Labs reviewed: Basic Metabolic Panel:  Recent Labs  78/29/56 0855 08/03/12 0500 08/04/12 0615  NA 131* 136 137  K 4.5 4.1 4.1  CL 98 102 103  CO2 23 26 21   GLUCOSE 113* 109* 75  BUN 20 21 22   CREATININE 1.05 0.95 0.79  CALCIUM 8.0* 8.0* 8.2*   Liver Function Tests: No results found for this basename: AST, ALT, ALKPHOS, BILITOT, PROT, ALBUMIN,  in the last 8760 hours No results found for this basename: LIPASE, AMYLASE,  in the last 8760 hours No results found for this basename: AMMONIA,  in the last 8760 hours CBC:  Recent Labs  07/31/12 1639  08/02/12 0855 08/03/12 0500 08/04/12 0615  WBC 10.7*  < > 7.3 6.8 4.6  NEUTROABS 9.9*  --   --   --   --   HGB 13.5  < > 7.8* 8.7* 8.1*  HCT 39.5  < > 22.2* 24.7* 23.1*  MCV 89.0  < > 88.4 85.8 87.5  PLT 227  < > 156 156 169  < > = values in this interval not displayed.   Assessment/Plan  Depression- continue celexa and ativan, has remained calm but has ocassional burst out  HTN- bp has been elevated. Will increase amlodipine to 10 mg daily and continue aspirin and clonidine  Iron deficiency anemia- continue iron supplement and monitor cbc periodically  constipation- continue current bowel regimen  OA- continue ca-vit d with calcium supplement and tyleonl, symptoms under control. Will d/c tramadol  Dementia- persists, off medications currently. Fall precautions. Encourage group activity  participation    Labs/tests ordered- cbc, bmp every 3 months

## 2013-05-19 ENCOUNTER — Emergency Department (HOSPITAL_COMMUNITY)
Admission: EM | Admit: 2013-05-19 | Discharge: 2013-05-19 | Disposition: A | Discharge status: Skilled Nursing Facility | Payer: Medicare Other | Attending: Emergency Medicine | Admitting: Emergency Medicine

## 2013-05-19 ENCOUNTER — Emergency Department (HOSPITAL_COMMUNITY): Payer: Medicare Other

## 2013-05-19 ENCOUNTER — Encounter (HOSPITAL_COMMUNITY): Payer: Self-pay | Admitting: Emergency Medicine

## 2013-05-19 DIAGNOSIS — Z7982 Long term (current) use of aspirin: Secondary | ICD-10-CM | POA: Insufficient documentation

## 2013-05-19 DIAGNOSIS — Z7901 Long term (current) use of anticoagulants: Secondary | ICD-10-CM | POA: Insufficient documentation

## 2013-05-19 DIAGNOSIS — I129 Hypertensive chronic kidney disease with stage 1 through stage 4 chronic kidney disease, or unspecified chronic kidney disease: Secondary | ICD-10-CM | POA: Insufficient documentation

## 2013-05-19 DIAGNOSIS — IMO0002 Reserved for concepts with insufficient information to code with codable children: Secondary | ICD-10-CM | POA: Insufficient documentation

## 2013-05-19 DIAGNOSIS — F3289 Other specified depressive episodes: Secondary | ICD-10-CM | POA: Insufficient documentation

## 2013-05-19 DIAGNOSIS — Z8669 Personal history of other diseases of the nervous system and sense organs: Secondary | ICD-10-CM | POA: Insufficient documentation

## 2013-05-19 DIAGNOSIS — Z853 Personal history of malignant neoplasm of breast: Secondary | ICD-10-CM | POA: Insufficient documentation

## 2013-05-19 DIAGNOSIS — Y939 Activity, unspecified: Secondary | ICD-10-CM | POA: Insufficient documentation

## 2013-05-19 DIAGNOSIS — F0390 Unspecified dementia without behavioral disturbance: Secondary | ICD-10-CM | POA: Insufficient documentation

## 2013-05-19 DIAGNOSIS — Z8781 Personal history of (healed) traumatic fracture: Secondary | ICD-10-CM | POA: Insufficient documentation

## 2013-05-19 DIAGNOSIS — D649 Anemia, unspecified: Secondary | ICD-10-CM | POA: Insufficient documentation

## 2013-05-19 DIAGNOSIS — F329 Major depressive disorder, single episode, unspecified: Secondary | ICD-10-CM | POA: Insufficient documentation

## 2013-05-19 DIAGNOSIS — S0001XA Abrasion of scalp, initial encounter: Secondary | ICD-10-CM

## 2013-05-19 DIAGNOSIS — Z8719 Personal history of other diseases of the digestive system: Secondary | ICD-10-CM | POA: Insufficient documentation

## 2013-05-19 DIAGNOSIS — W19XXXA Unspecified fall, initial encounter: Secondary | ICD-10-CM | POA: Insufficient documentation

## 2013-05-19 DIAGNOSIS — S0990XA Unspecified injury of head, initial encounter: Secondary | ICD-10-CM

## 2013-05-19 DIAGNOSIS — Z79899 Other long term (current) drug therapy: Secondary | ICD-10-CM | POA: Insufficient documentation

## 2013-05-19 DIAGNOSIS — N189 Chronic kidney disease, unspecified: Secondary | ICD-10-CM | POA: Insufficient documentation

## 2013-05-19 DIAGNOSIS — Y929 Unspecified place or not applicable: Secondary | ICD-10-CM | POA: Insufficient documentation

## 2013-05-19 DIAGNOSIS — M129 Arthropathy, unspecified: Secondary | ICD-10-CM | POA: Insufficient documentation

## 2013-05-19 DIAGNOSIS — F411 Generalized anxiety disorder: Secondary | ICD-10-CM | POA: Insufficient documentation

## 2013-05-19 MED ORDER — HALOPERIDOL 2 MG PO TABS: 3.0000 mg | ORAL_TABLET | Freq: Once | ORAL | Status: DC

## 2013-05-19 MED ORDER — LORAZEPAM 2 MG/ML IJ SOLN
1.0000 mg | Freq: Once | INTRAMUSCULAR | Status: AC
  Administered 2013-05-19: 1 mg via INTRAMUSCULAR
  Filled 2013-05-19: qty 1

## 2013-05-19 MED ORDER — HALOPERIDOL LACTATE 5 MG/ML IJ SOLN
3.0000 mg | Freq: Once | INTRAMUSCULAR | Status: AC
  Administered 2013-05-19: 3 mg via INTRAMUSCULAR
  Filled 2013-05-19: qty 1

## 2013-05-19 NOTE — ED Notes (Signed)
To ED from [facility] for fall, ?LOC, hx of dementia, arrives in KED and C-collar, ASA but no other blood thinners, bleeding and hematoma noted to forehead, per nursing home staff, pt is at her neuro baseline, NAD

## 2013-05-19 NOTE — ED Provider Notes (Signed)
CSN: [id number]     Arrival date & time 05/19/13  1301 History   First MD Initiated Contact with Patient 05/19/13 1309     Chief Complaint  Patient presents with  . Fall   (Consider location/radiation/quality/duration/timing/severity/associated sxs/prior Treatment) HPI  86yF presenting from NH for evaluation after a fall. Happened just before arrival. Unclear if there was loss of consciousness or not. Patient has a history dementia. Per report, she is at her baseline mental status. No vomiting. No blood thinners listed on medication list. Pt denies pain but unreliable historian.    Past Medical History  Diagnosis Date  . Hypertension   . Dementia   . Chronic kidney disease   . Cancer     HX OF BREAST CANCER  . Arthritis   . Macular degeneration   . Anxiety state, unspecified   . Unspecified constipation   . Osteoporosis, unspecified   . Abnormality of gait   . Fracture of neck of femur   . Peripheral vascular complications   . Personal history of fall   . Aftercare for healing traumatic fracture of hip   . Long term (current) use of anticoagulants   . Anemia   . Depression    Past Surgical History  Procedure Laterality Date  . Lumpectormy    . Hip arthroplasty  08/01/2012    Procedure: ARTHROPLASTY BIPOLAR HIP;  Surgeon: Mable Paris, MD;  Location: [facility] OR;  Service: Orthopedics;  Laterality: Left;  Surgeon requests to follow 1st case.  . Abdominal hysterectomy    . Fracture surgery Left 07/2012    hip   No family history on file. History  Substance Use Topics  . Smoking status: Never Smoker   . Smokeless tobacco: Never Used  . Alcohol Use: No   OB History   Grav Para Term Preterm Abortions TAB SAB Ect Mult Living                 Review of Systems  Level 5 caveat because pt has advanced dementia.    Allergies  Codeine  Home Medications   Current Outpatient Rx  Name  Route  Sig  Dispense  Refill  . acetaminophen (TYLENOL) 325 MG tablet    Oral   Take 650 mg by mouth every 8 (eight) hours.         Marland Kitchen amLODipine (NORVASC) 10 MG tablet   Oral   Take 10 mg by mouth daily.         Marland Kitchen aspirin 81 MG chewable tablet   Oral   Chew 81 mg by mouth daily.         . calcium-vitamin D (OSCAL WITH D) 500-200 MG-UNIT per tablet   Oral   Take 1 tablet by mouth daily with breakfast.         . Cholecalciferol (VITAMIN D3) 2000 UNITS TABS   Oral   Take 1 capsule by mouth daily.         . citalopram (CELEXA) 20 MG tablet   Oral   Take 20 mg by mouth daily.         Marland Kitchen docusate sodium (COLACE) 100 MG capsule   Oral   Take 100 mg by mouth 2 (two) times daily as needed for constipation.         . ferrous sulfate 325 (65 FE) MG tablet   Oral   Take 325 mg by mouth daily with breakfast.         . lisinopril (PRINIVIL,ZESTRIL)  5 MG tablet   Oral   Take 5 mg by mouth daily.         Marland Kitchen LORazepam (ATIVAN) 0.5 MG tablet   Oral   Take 0.25 mg by mouth 2 (two) times daily. Take 1/2 tablet =0.25MG  by mouth twice a day  for anxiety, may take an additional 1/2 tablet as needed for agitation.         . sennosides-docusate sodium (SENOKOT-S) 8.6-50 MG tablet   Oral   Take 1 tablet by mouth daily.          BP 128/92  Pulse 96  Resp 18  SpO2 99% Physical Exam  Nursing note and vitals reviewed. Constitutional: She appears well-developed and well-nourished. No distress.  HENT:  Head: Normocephalic.  Frontal hematoma w/o overlying abrasion.   Eyes: Conjunctivae and EOM are normal. Right eye exhibits no discharge. Left eye exhibits no discharge.  Neck: Neck supple.  Cardiovascular: Normal rate, regular rhythm and normal heart sounds.  Exam reveals no gallop and no friction rub.   No murmur heard. Pulmonary/Chest: Effort normal and breath sounds normal. No respiratory distress.  Abdominal: Soft. She exhibits no distension. There is no tenderness.  Musculoskeletal: She exhibits no edema and no tenderness.  No bony  tenderness of extremities. No apparent pain with ROM of large joints.   Neurological: She is alert. No cranial nerve deficit. She exhibits normal muscle tone. Coordination normal.  Disoriented to time. Follows basic commands. Moving all extremitieis equally.   Skin: Skin is warm and dry.  Psychiatric:  Somewhat agitated    ED Course  Procedures (including critical care time) Labs Review Labs Reviewed - No data to display Imaging Review Ct Head Wo Contrast  05/19/2013   CLINICAL DATA:  Fall, frontal laceration, confusion, history hypertension, dementia  EXAM: CT HEAD WITHOUT CONTRAST  CT CERVICAL SPINE WITHOUT CONTRAST  TECHNIQUE: Multidetector CT imaging of the head and cervical spine was performed following the standard protocol without intravenous contrast. Multiplanar CT image reconstructions of the cervical spine were also generated.  COMPARISON:  None  FINDINGS: CT HEAD FINDINGS  Motion artifacts despite repeating images.  Generalized atrophy.  Normal ventricular morphology.  No midline shift or mass effect.  Small vessel chronic ischemic changes of deep cerebral white matter greatest at left frontal region.  Old lacunar infarct at left external capsule.  Streak artifacts from skullbase.  Within limits of motion, no definite intracranial hemorrhage, mass lesion, or evidence of acute infarction.  Hyperostosis frontalis interna.  Frontal scalp hematoma.  No gross evidence of calvarial fracture though examination is limited by motion.  CT CERVICAL SPINE FINDINGS  Bones diffusely demineralized.  Skullbase intact.  Multilevel facet degenerative changes.  Disc space narrowing with endplate spur formation at C5-C6 and C6-C7.  Prevertebral soft tissues normal thickness.  Minimal anterolisthesis at C6-C7 question related to facet disease. No definite acute fracture, additional subluxation, or bone destruction.  Numerous small nodules in both thyroid lobes.  IMPRESSION: CT HEAD IMPRESSION  Atrophy with  small vessel chronic ischemic changes in deep cerebral white matter.  Old infarct anterior aspect of left external capsule.  No definite acute intracranial abnormalities identified on examination limited by motion artifacts.  CT CERVICAL SPINE IMPRESSION  Osseous demineralization with multilevel degenerative disc and facet disease changes of the cervical spine.  No definite acute cervical spine abnormalities.   Electronically Signed   By: Ulyses Southward M.D.   On: 05/19/2013 15:19   Ct Cervical Spine Wo Contrast  05/19/2013   CLINICAL DATA:  Fall, frontal laceration, confusion, history hypertension, dementia  EXAM: CT HEAD WITHOUT CONTRAST  CT CERVICAL SPINE WITHOUT CONTRAST  TECHNIQUE: Multidetector CT imaging of the head and cervical spine was performed following the standard protocol without intravenous contrast. Multiplanar CT image reconstructions of the cervical spine were also generated.  COMPARISON:  None  FINDINGS: CT HEAD FINDINGS  Motion artifacts despite repeating images.  Generalized atrophy.  Normal ventricular morphology.  No midline shift or mass effect.  Small vessel chronic ischemic changes of deep cerebral white matter greatest at left frontal region.  Old lacunar infarct at left external capsule.  Streak artifacts from skullbase.  Within limits of motion, no definite intracranial hemorrhage, mass lesion, or evidence of acute infarction.  Hyperostosis frontalis interna.  Frontal scalp hematoma.  No gross evidence of calvarial fracture though examination is limited by motion.  CT CERVICAL SPINE FINDINGS  Bones diffusely demineralized.  Skullbase intact.  Multilevel facet degenerative changes.  Disc space narrowing with endplate spur formation at C5-C6 and C6-C7.  Prevertebral soft tissues normal thickness.  Minimal anterolisthesis at C6-C7 question related to facet disease. No definite acute fracture, additional subluxation, or bone destruction.  Numerous small nodules in both thyroid lobes.   IMPRESSION: CT HEAD IMPRESSION  Atrophy with small vessel chronic ischemic changes in deep cerebral white matter.  Old infarct anterior aspect of left external capsule.  No definite acute intracranial abnormalities identified on examination limited by motion artifacts.  CT CERVICAL SPINE IMPRESSION  Osseous demineralization with multilevel degenerative disc and facet disease changes of the cervical spine.  No definite acute cervical spine abnormalities.   Electronically Signed   By: Ulyses Southward M.D.   On: 05/19/2013 15:19   EKG:  Rhythm: normal sinus Vent. rate 96 BPM PR interval 194 ms QRS duration 91 ms QT/QTc 355/449 ms ST segments: NS ST changes   MDM   1. Head injury, initial encounter   2. Scalp abrasion, initial encounter   3. Fall, initial encounter    86yF presenting after a fall. Imaging neg for emergent traumatic injury. At reported baseline. Continued local wound care.     Raeford Razor, MD 05/23/13 1538

## 2013-05-20 ENCOUNTER — Encounter: Payer: Self-pay | Admitting: Internal Medicine

## 2013-05-20 ENCOUNTER — Non-Acute Institutional Stay (SKILLED_NURSING_FACILITY): Payer: PRIVATE HEALTH INSURANCE | Admitting: Internal Medicine

## 2013-05-20 DIAGNOSIS — W19XXXS Unspecified fall, sequela: Secondary | ICD-10-CM

## 2013-05-20 DIAGNOSIS — D509 Iron deficiency anemia, unspecified: Secondary | ICD-10-CM

## 2013-05-20 DIAGNOSIS — F05 Delirium due to known physiological condition: Secondary | ICD-10-CM | POA: Insufficient documentation

## 2013-05-20 DIAGNOSIS — F329 Major depressive disorder, single episode, unspecified: Secondary | ICD-10-CM

## 2013-05-20 DIAGNOSIS — F0390 Unspecified dementia without behavioral disturbance: Secondary | ICD-10-CM

## 2013-05-20 DIAGNOSIS — K5900 Constipation, unspecified: Secondary | ICD-10-CM

## 2013-05-20 NOTE — Progress Notes (Signed)
Patient ID: Morgan [name], female   DOB: 30-Oct-1926, 79 y.o.   MRN: [id number]  [facility]  Chief Complaint  Patient presents with  . Medical Managment of Chronic Issues    annual exam    Code status- DNR  Allergies  Allergen Reactions  . Codeine Nausea And Vomiting    HPI 79 y/o female patient is here for long term care. She was seen in her room on her wheelchair. She had a mechanical fall yesterday and bruised her face and hands. She was sent to the ER and had CT of her head which was negative for acute fracture or stroke or bleed. She has been hallucinating recently and has been more confused since yesterday. She is not able to provide any history or ROS but is talking loudly on the hallway with her sentences not making sense. She appears in no distress. Reviewed her medications and labs  ROS As per staff No fever or chills No chest pain or dyspnea noted Regular bowel movement No jerky movements Appetite is good  Past Medical History  Diagnosis Date  . Hypertension   . Dementia   . Chronic kidney disease   . Cancer     HX OF BREAST CANCER  . Arthritis   . Macular degeneration   . Anxiety state, unspecified   . Unspecified constipation   . Osteoporosis, unspecified   . Abnormality of gait   . Fracture of neck of femur   . Peripheral vascular complications   . Personal history of fall   . Aftercare for healing traumatic fracture of hip   . Long term (current) use of anticoagulants   . Anemia   . Depression    Past Surgical History  Procedure Laterality Date  . Lumpectormy    . Hip arthroplasty  08/01/2012    Procedure: ARTHROPLASTY BIPOLAR HIP;  Surgeon: Mable Paris, MD;  Location: [facility] OR;  Service: Orthopedics;  Laterality: Left;  Surgeon requests to follow 1st case.  . Abdominal hysterectomy    . Fracture surgery Left 07/2012    hip   Current Outpatient Prescriptions on File Prior to Visit  Medication Sig Dispense Refill  .  acetaminophen (TYLENOL) 325 MG tablet Take 650 mg by mouth every 8 (eight) hours.      Marland Kitchen amLODipine (NORVASC) 10 MG tablet Take 10 mg by mouth daily.      Marland Kitchen aspirin 81 MG chewable tablet Chew 81 mg by mouth daily.      . calcium-vitamin D (OSCAL WITH D) 500-200 MG-UNIT per tablet Take 1 tablet by mouth daily with breakfast.      . Cholecalciferol (VITAMIN D3) 2000 UNITS TABS Take 1 capsule by mouth daily.      . citalopram (CELEXA) 20 MG tablet Take 20 mg by mouth daily.      Marland Kitchen docusate sodium (COLACE) 100 MG capsule Take 100 mg by mouth 2 (two) times daily as needed for constipation.      . ferrous sulfate 325 (65 FE) MG tablet Take 325 mg by mouth daily with breakfast.      . lisinopril (PRINIVIL,ZESTRIL) 5 MG tablet Take 5 mg by mouth daily.      Marland Kitchen LORazepam (ATIVAN) 0.5 MG tablet Take 0.25 mg by mouth 2 (two) times daily. Take 1/2 tablet =0.25MG  by mouth twice a day  for anxiety, may take an additional 1/2 tablet as needed for agitation.      . sennosides-docusate sodium (SENOKOT-S) 8.6-50 MG  tablet Take 1 tablet by mouth daily.       No current facility-administered medications on file prior to visit.     BP 144/68  Pulse 70  Temp(Src) 96.8 F (36 C)  Resp 20  SpO2 97%  Physical Exam  Constitutional: She appears well-developed. No distress.  HENT:   Head: Normocephalic.   Mouth/Throat: Oropharynx is clear and moist.  Eyes: Conjunctivae are normal. Pupils are equal, round, and reactive to light.  Neck: Normal range of motion. Neck supple.  Cardiovascular: Normal rate, regular rhythm, normal heart sounds and intact distal pulses.    No murmur heard. Pulmonary/Chest: Effort normal and breath sounds normal.  Abdominal: Soft. Bowel sounds are normal. She exhibits no distension and no mass. There is no rebound.  Musculoskeletal: Normal range of motion. She exhibits no edema and no tenderness.  Able to move all 4, propelled in wheelchair by others, fall risk  Skin: Skin is warm and  dry. She is not diaphoretic. Has bruise on her face and forehead and arms Psychiatric:  Poor insight, poor judgement, dementia    Labs reviewed-  CBC    Component Value Date/Time   WBC 4.6 08/04/2012 0615   WBC 4.0 05/25/2008 1022   RBC 2.64* 08/04/2012 0615   HGB 8.1* 08/04/2012 0615   HGB 12.4 05/25/2008 1022   HCT 23.1* 08/04/2012 0615   HCT 35.7 05/25/2008 1022   PLT 169 08/04/2012 0615   PLT 225 05/25/2008 1022   MCV 87.5 08/04/2012 0615   MCV 90 05/25/2008 1022   MCH 30.7 08/04/2012 0615   MCH 31.3 05/25/2008 1022   MCHC 35.1 08/04/2012 0615   MCHC 34.7 05/25/2008 1022   RDW 13.6 08/04/2012 0615   RDW 12.9 05/25/2008 1022   LYMPHSABS 0.4* 07/31/2012 1639   LYMPHSABS 1.1 05/25/2008 1022   MONOABS 0.4 07/31/2012 1639   EOSABS 0.0 07/31/2012 1639   EOSABS 0.1 05/25/2008 1022   BASOSABS 0.0 07/31/2012 1639   BASOSABS 0.0 05/25/2008 1022   01/21/13 wbc 3.1, hb 11.2, na 137, k 4.2, bun 20, cr 0.96, glu 94, ca 8.4  Imaging 05/20/13  IMPRESSION: CT HEAD IMPRESSION   Atrophy with small vessel chronic ischemic changes in deep cerebral white matter.   Old infarct anterior aspect of left external capsule.   No definite acute intracranial abnormalities identified on examination limited by motion artifacts.   CT CERVICAL SPINE IMPRESSION   Osseous demineralization with multilevel degenerative disc and facet disease changes of the cervical spine.   No definite acute cervical spine abnormalities.    Assessment/plan  Acute confusion- will send u/a with c/s. Check cbc with diff, cmp to rule out lyte abnormality and bleed along with infection. With her known to have baseline confusion in setting of her dementia and further worsening, will start her on seroquel 12. 5 mg daily for now. Avoid sedative and hypnotics and drugs from BEER criteria for now. Fall precautions  Fall- had a mechanical fall yesterday. Fall precautions for now  Depression- continue celexa for now. Will  discontinue her standing order for ativan and give 0.25 mg po qhs prn only for severe agitation only   HTN- bp has been stable. Continue amlodipine and lisinopril for now. Continue aspirin  Iron deficiency anemia- continue iron supplement and monitor cbc periodically  constipation- continue current bowel regimen  OA- continue ca-vit d with calcium supplement and tyleonl, symptoms under control.   Dementia- persists, off medications currently. Fall precautions.

## 2013-05-21 ENCOUNTER — Other Ambulatory Visit: Payer: Self-pay | Admitting: *Deleted

## 2013-05-21 MED ORDER — LORAZEPAM 0.5 MG PO TABS: ORAL_TABLET | ORAL | Status: DC

## 2013-07-08 ENCOUNTER — Non-Acute Institutional Stay (SKILLED_NURSING_FACILITY): Payer: PRIVATE HEALTH INSURANCE | Admitting: Internal Medicine

## 2013-07-08 DIAGNOSIS — M1990 Unspecified osteoarthritis, unspecified site: Secondary | ICD-10-CM

## 2013-07-08 DIAGNOSIS — F329 Major depressive disorder, single episode, unspecified: Secondary | ICD-10-CM

## 2013-07-08 DIAGNOSIS — K5900 Constipation, unspecified: Secondary | ICD-10-CM

## 2013-07-08 DIAGNOSIS — I10 Essential (primary) hypertension: Secondary | ICD-10-CM

## 2013-07-08 DIAGNOSIS — F0390 Unspecified dementia without behavioral disturbance: Secondary | ICD-10-CM

## 2013-07-08 NOTE — Progress Notes (Signed)
Patient ID: Morgan [name], female   DOB: 02-May-1927, 79 y.o.   MRN: [id number]  [facility]  Code status- DNR  Chief complaint- medical management of chronic illness  Allergies- codeine  HPI 79 y/o female patient is here for long term care. She was seen in her room on her wheelchair. She is not able to provide any history or ROS but is talking loudly on the hallway with her sentences not making sense. She appears in no distress. Reviewed her medications and labs  ROS As per staff No fever or chills No chest pain or dyspnea noted Regular bowel movement Appetite is good  Past Medical History  Diagnosis Date  . Hypertension   . Dementia   . Chronic kidney disease   . Cancer     HX OF BREAST CANCER  . Arthritis   . Macular degeneration   . Anxiety state, unspecified   . Unspecified constipation   . Osteoporosis, unspecified   . Abnormality of gait   . Fracture of neck of femur   . Peripheral vascular complications   . Personal history of fall   . Aftercare for healing traumatic fracture of hip   . Long term (current) use of anticoagulants   . Anemia   . Depression    Past Surgical History  Procedure Laterality Date  . Lumpectormy    . Hip arthroplasty  08/01/2012    Procedure: ARTHROPLASTY BIPOLAR HIP;  Surgeon: Mable Paris, MD;  Location: [facility];  Service: Orthopedics;  Laterality: Left;  Surgeon requests to follow 1st case.  . Abdominal hysterectomy    . Fracture surgery Left 07/2012    hip   Medication reviewed. See [facility]   Physical Exam   Nursing note and vital signs reviewed   Constitutional: She appears well-developed. No distress.   HENT:   Head: Normocephalic.   Mouth/Throat: Oropharynx is clear and moist.   Eyes: Conjunctivae are normal. Pupils are equal, round, and reactive to light.   Neck: Normal range of motion. Neck supple.   Cardiovascular: Normal rate, regular rhythm, normal heart sounds and intact distal pulses.    No  murmur heard. Pulmonary/Chest: Effort normal and breath sounds normal.   Abdominal: Soft. Bowel sounds are normal. She exhibits no distension and no mass. There is no rebound.  Musculoskeletal: Normal range of motion. She exhibits no edema and no tenderness.  Able to move all 4, propelled in wheelchair by others, fall risk  Skin: Skin is warm and dry. She is not diaphoretic. Has bruise on her face and forehead and arms Psychiatric:  Poor insight, poor judgement, dementia   Assessment/plan  HTN- bp has been stable. Continue amlodipine and lisinopril for now. Continue aspirin  Depression- continue celexa 20 mg daily, seroquel 12.5 mg daily for now with depakote 125 mg bid. Will continue ativan 0.25mg  qhs prn  Iron deficiency anemia- continue iron supplement and monitor cbc periodically  constipation- continue current bowel regimen  OA- continue ca-vit d with calcium supplement and tyleonl, symptoms under control.   Dementia- persists, off medications currently. Fall precautions.

## 2013-08-19 ENCOUNTER — Non-Acute Institutional Stay (SKILLED_NURSING_FACILITY): Payer: PRIVATE HEALTH INSURANCE | Admitting: Internal Medicine

## 2013-08-19 DIAGNOSIS — I10 Essential (primary) hypertension: Secondary | ICD-10-CM

## 2013-08-19 DIAGNOSIS — M1990 Unspecified osteoarthritis, unspecified site: Secondary | ICD-10-CM

## 2013-08-19 DIAGNOSIS — F0150 Vascular dementia without behavioral disturbance: Secondary | ICD-10-CM | POA: Insufficient documentation

## 2013-08-19 DIAGNOSIS — F0151 Vascular dementia with behavioral disturbance: Secondary | ICD-10-CM

## 2013-08-19 DIAGNOSIS — D509 Iron deficiency anemia, unspecified: Secondary | ICD-10-CM

## 2013-08-19 DIAGNOSIS — K5900 Constipation, unspecified: Secondary | ICD-10-CM

## 2013-08-19 NOTE — Progress Notes (Signed)
Patient ID: Morgan [name], female   DOB: [birth year], 79 y.o.   MRN: [id number]  [facility]  Code status- DNR  Chief complaint- medical management of chronic illness  Allergies- codeine  HPI 79 y/o female patient is here for long term care. She was seen in her bed. She continues to have some repetitive behavior in terms of asking questions. She is under total care.  She appears in no distress. Her depakote dose has been adjusted recently. Reviewed her medications and labs  ROS As per staff No fever or chills No chest pain or dyspnea noted Regular bowel movement Appetite normal. No nausea or complex  Past Medical History  Diagnosis Date  . Hypertension   . Dementia   . Chronic kidney disease   . Cancer     HX OF BREAST CANCER  . Arthritis   . Macular degeneration   . Anxiety state, unspecified   . Unspecified constipation   . Osteoporosis, unspecified   . Abnormality of gait   . Fracture of neck of femur   . Peripheral vascular complications   . Personal history of fall   . Aftercare for healing traumatic fracture of hip   . Long term (current) use of anticoagulants   . Anemia   . Depression    Medication reviewed. See [facility]  Physical exam BP 150/66  Pulse 83  Temp(Src) 97.4 F (36.3 C)  Resp 16  SpO2 96%  Nursing note and vital signs reviewed   Constitutional: She appears well-developed. No distress.   HENT:   Head: Normocephalic.   Mouth/Throat: Oropharynx is clear and moist.   Eyes: Conjunctivae are normal. Pupils are equal, round, and reactive to light.   Neck: Normal range of motion. Neck supple.   Cardiovascular: Normal rate, regular rhythm, normal heart sounds and intact distal pulses.    No murmur heard. Pulmonary/Chest: Effort normal and breath sounds normal.   Abdominal: Soft. Bowel sounds are normal. She exhibits no distension and no mass. There is no rebound.  Musculoskeletal: Normal range of motion. She exhibits no edema and no  tenderness.  Able to move all 4, propelled in wheelchair by others, fall risk  Skin: Skin is warm and dry. She is not diaphoretic. Has keratotic skin changes Psychiatric:  Poor insight, poor judgement, dementia   Assessment/plan  Vascular Dementia- persists, off medications currently. Fall precautions.  Depression in setting of dementia- continue celexa 20 mg daily, seroquel 12.5 mg daily for now with depakote 500 mg bid. Will continue ativan and monitor for sedation  HTN- bp has been stable. Continue amlodipine and lisinopril for now. Continue aspirin  Iron deficiency anemia- continue iron supplement and monitor cbc periodically  constipation- continue current bowel regimen  OA- continue ca-vit d with calcium supplement and tyleonl, symptoms under control.

## 2013-10-08 ENCOUNTER — Encounter: Payer: Self-pay | Admitting: Internal Medicine

## 2013-10-08 ENCOUNTER — Non-Acute Institutional Stay (SKILLED_NURSING_FACILITY): Payer: PRIVATE HEALTH INSURANCE | Admitting: Internal Medicine

## 2013-10-08 DIAGNOSIS — K5900 Constipation, unspecified: Secondary | ICD-10-CM

## 2013-10-08 DIAGNOSIS — F0151 Vascular dementia with behavioral disturbance: Secondary | ICD-10-CM

## 2013-10-08 DIAGNOSIS — F482 Pseudobulbar affect: Secondary | ICD-10-CM

## 2013-10-08 DIAGNOSIS — M1990 Unspecified osteoarthritis, unspecified site: Secondary | ICD-10-CM

## 2013-10-08 DIAGNOSIS — D509 Iron deficiency anemia, unspecified: Secondary | ICD-10-CM

## 2013-10-08 DIAGNOSIS — I10 Essential (primary) hypertension: Secondary | ICD-10-CM

## 2013-10-08 DIAGNOSIS — F329 Major depressive disorder, single episode, unspecified: Secondary | ICD-10-CM

## 2013-10-08 DIAGNOSIS — F0150 Vascular dementia without behavioral disturbance: Secondary | ICD-10-CM

## 2013-10-08 DIAGNOSIS — F0153 Vascular dementia, unspecified severity, with mood disturbance: Secondary | ICD-10-CM

## 2013-10-08 DIAGNOSIS — N390 Urinary tract infection, site not specified: Secondary | ICD-10-CM

## 2013-10-08 NOTE — Progress Notes (Signed)
Patient ID: Morgan [name], female   DOB: 05-31-1927, 80 y.o.   MRN: [id number]    [facility]  Code status- DNR  Chief complaint- medical management of chronic illness  Allergies- codeine  HPI 80 y/o female patient is here for long term care. She is being treated for uti at present.  She was seen in her bed. She continues to have some repetitive behavior in terms of asking questions. She is under total care.  She appears in no distress.  ROS As per staff No fever or chills No chest pain or dyspnea noted Appetite normal.    Medication reviewed. See [facility]   Physical Exam  BP 127/57  Pulse 71  Temp(Src) 98 F (36.7 C)  Resp 16  SpO2 95%  Nursing note and vital signs reviewed   Constitutional: She appears well-developed. No distress.   HENT:   Head: Normocephalic.   Mouth/Throat: Oropharynx is clear and moist.   Eyes: Conjunctivae are normal. Pupils are equal, round, and reactive to light.   Neck: Normal range of motion. Neck supple.   Cardiovascular: Normal rate, regular rhythm, normal heart sounds and intact distal pulses.    No murmur heard. Pulmonary/Chest: Effort normal and breath sounds normal.   Abdominal: Soft. Bowel sounds are normal. She exhibits no distension and no mass. There is no rebound.  Musculoskeletal: Normal range of motion. She exhibits no edema and no tenderness.  Able to move all 4, propelled in wheelchair by others, fall risk  Skin: Skin is warm and dry. She is not diaphoretic. Has bruise on her face and forehead and arms Psychiatric:  Poor insight, poor judgement, dementia   Assessment/plan  uti- complete course of ciprofloxacin for her uti with florastor, encourage hydration, monitor clinically  psuedobulbar affect- started on nudexta and has tolerated it well. Continue nudexta  HTN- bp has been stable. Will decrease amlodipine to 5 mg daily and continue lisinopril 5 mg daily for now. Continue aspirin  Depression- continue  celexa 20 mg daily, seroquel 25mg  daily for now with depakote 125 mg bid. Will continue ativan 0.25mg  qhs prn  Dementia- persists, off medications currently. Fall precautions.  Iron deficiency anemia- continue iron supplement and monitor cbc periodically  constipation- continue current bowel regimen  OA- continue ca-vit d with calcium supplement and tylenol, symptoms under control.

## 2013-10-11 LAB — HM DIABETES EYE EXAM

## 2013-11-01 ENCOUNTER — Non-Acute Institutional Stay (SKILLED_NURSING_FACILITY): Payer: PRIVATE HEALTH INSURANCE | Admitting: Internal Medicine

## 2013-11-01 DIAGNOSIS — F22 Delusional disorders: Secondary | ICD-10-CM

## 2013-11-01 DIAGNOSIS — F0151 Vascular dementia with behavioral disturbance: Secondary | ICD-10-CM

## 2013-11-01 DIAGNOSIS — F01518 Vascular dementia, unspecified severity, with other behavioral disturbance: Secondary | ICD-10-CM

## 2013-11-01 DIAGNOSIS — F482 Pseudobulbar affect: Secondary | ICD-10-CM

## 2013-11-01 DIAGNOSIS — E46 Unspecified protein-calorie malnutrition: Secondary | ICD-10-CM

## 2013-11-01 DIAGNOSIS — I10 Essential (primary) hypertension: Secondary | ICD-10-CM

## 2013-11-01 DIAGNOSIS — F0150 Vascular dementia without behavioral disturbance: Secondary | ICD-10-CM

## 2013-11-01 NOTE — Progress Notes (Signed)
Patient ID: Morgan [name], female   DOB: [birth year], 80 y.o.   MRN: [id number]     [facility]  Code status- DNR  Chief complaint- medical management of chronic illness  Allergies- codeine  HPI 80 y/o female patient is here for long term care. Her uti has resolved. She is at her baseline and has no concerns. She is followed by [facility] for depression, delusional disorder. She has dementia. She is a fall risk patient but no recent falls.     ROS Unable to obtain from patient  Past Medical History  Diagnosis Date  . Hypertension   . Dementia   . Chronic kidney disease   . Cancer     HX OF BREAST CANCER  . Arthritis   . Macular degeneration   . Anxiety state, unspecified   . Unspecified constipation   . Osteoporosis, unspecified   . Abnormality of gait   . Fracture of neck of femur   . Peripheral vascular complications   . Personal history of fall   . Aftercare for healing traumatic fracture of hip   . Long term (current) use of anticoagulants   . Anemia   . Depression    Past Surgical History  Procedure Laterality Date  . Lumpectormy    . Hip arthroplasty  08/01/2012    Procedure: ARTHROPLASTY BIPOLAR HIP;  Surgeon: Nita Sells, MD;  Location: [place];  Service: Orthopedics;  Laterality: Left;  Surgeon requests to follow 1st case.  . Abdominal hysterectomy    . Fracture surgery Left 07/2012    hip   Medication reviewed. See [facility]  Physical exam BP 129/72  Pulse 85  Temp(Src) 97 F (36.1 C)  Resp 18  SpO2 97%  Nursing note and vital signs reviewed   Constitutional: She appears well-developed. No distress.   HENT:   Head: Normocephalic.   Mouth/Throat: Oropharynx is clear and moist.   Eyes: Conjunctivae are normal. Pupils are equal, round, and reactive to light.   Neck: Normal range of motion. Neck supple.   Cardiovascular: Normal rate, regular rhythm, normal heart sounds and intact distal pulses.    No murmur heard. Pulmonary/Chest:  Effort normal and breath sounds normal.   Abdominal: Soft. Bowel sounds are normal. She exhibits no distension and no mass. There is no rebound.  Musculoskeletal: Normal range of motion. She exhibits no edema and no tenderness. Able to move all 4, propelled in wheelchair by others, fall risk  Skin: Skin is warm and dry. She is not diaphoretic. Has bruise on her face and forehead and arms Psychiatric:  Poor insight, poor judgement, dementia   Assessment/plan  Dementia- persists, off medications currently. Fall precautions. Skin care  psuedobulbar affect- tolerating nudexta well  Delusional disorder- on seroquel, some amount of delusion persists but patient has been calm  Protein calorie malnutrition- in setting of her dementia with slow decline. Continue medpass for now  HTN- bp has been stable. Continue amlodipine and lisinopril

## 2013-11-10 DIAGNOSIS — F22 Delusional disorders: Secondary | ICD-10-CM | POA: Insufficient documentation

## 2013-11-10 DIAGNOSIS — E46 Unspecified protein-calorie malnutrition: Secondary | ICD-10-CM | POA: Insufficient documentation

## 2013-11-29 ENCOUNTER — Non-Acute Institutional Stay (SKILLED_NURSING_FACILITY): Payer: PRIVATE HEALTH INSURANCE | Admitting: Internal Medicine

## 2013-11-29 ENCOUNTER — Encounter: Payer: Self-pay | Admitting: Internal Medicine

## 2013-11-29 DIAGNOSIS — F0151 Vascular dementia with behavioral disturbance: Secondary | ICD-10-CM

## 2013-11-29 DIAGNOSIS — F0393 Unspecified dementia, unspecified severity, with mood disturbance: Secondary | ICD-10-CM

## 2013-11-29 DIAGNOSIS — F22 Delusional disorders: Principal | ICD-10-CM

## 2013-11-29 DIAGNOSIS — E559 Vitamin D deficiency, unspecified: Secondary | ICD-10-CM | POA: Insufficient documentation

## 2013-11-29 DIAGNOSIS — I10 Essential (primary) hypertension: Secondary | ICD-10-CM

## 2013-11-29 DIAGNOSIS — F0152 Vascular dementia, unspecified severity, with psychotic disturbance: Secondary | ICD-10-CM | POA: Insufficient documentation

## 2013-11-29 DIAGNOSIS — F0280 Dementia in other diseases classified elsewhere without behavioral disturbance: Secondary | ICD-10-CM

## 2013-11-29 DIAGNOSIS — F482 Pseudobulbar affect: Secondary | ICD-10-CM

## 2013-11-29 DIAGNOSIS — F329 Major depressive disorder, single episode, unspecified: Secondary | ICD-10-CM

## 2013-11-29 DIAGNOSIS — F3289 Other specified depressive episodes: Secondary | ICD-10-CM

## 2013-11-29 DIAGNOSIS — K5900 Constipation, unspecified: Secondary | ICD-10-CM

## 2013-11-29 NOTE — Progress Notes (Signed)
Patient ID: Morgan [name], female   DOB: 1927/01/05, 80 y.o.   MRN: [id number]    [facility]  Code status- DNR  Chief complaint- medical management of chronic illness  Allergies- codeine  HPI 80 y/o female patient is here for long term care. She is at her baseline and has no concerns. She is followed by [facility] for depression, delusional disorder. She has dementia. She is a fall risk patient but no recent falls.     ROS Unable to obtain  Past Medical History  Diagnosis Date  . Hypertension   . Dementia   . Chronic kidney disease   . Cancer     HX OF BREAST CANCER  . Arthritis   . Macular degeneration   . Anxiety state, unspecified   . Unspecified constipation   . Osteoporosis, unspecified   . Abnormality of gait   . Fracture of neck of femur   . Peripheral vascular complications   . Personal history of fall   . Aftercare for healing traumatic fracture of hip   . Long term (current) use of anticoagulants   . Anemia   . Depression    Current Outpatient Prescriptions on File Prior to Visit  Medication Sig Dispense Refill  . acetaminophen (TYLENOL) 325 MG tablet Take 650 mg by mouth every 8 (eight) hours.      Marland Kitchen amLODipine (NORVASC) 10 MG tablet Take 5 mg by mouth daily.       Marland Kitchen aspirin 81 MG chewable tablet Chew 81 mg by mouth daily.      . calcium-vitamin D (OSCAL WITH D) 500-200 MG-UNIT per tablet Take 1 tablet by mouth daily with breakfast.      . Cholecalciferol (VITAMIN D3) 2000 UNITS TABS Take 1 capsule by mouth daily.      . citalopram (CELEXA) 20 MG tablet Take 20 mg by mouth daily.      Marland Kitchen docusate sodium (COLACE) 100 MG capsule Take 100 mg by mouth 2 (two) times daily as needed for constipation.      . ferrous sulfate 325 (65 FE) MG tablet Take 325 mg by mouth daily with breakfast.      . lisinopril (PRINIVIL,ZESTRIL) 5 MG tablet Take 5 mg by mouth daily.      Marland Kitchen LORazepam (ATIVAN) 0.5 MG tablet Take 1/2 tablet by mouth every night at bedtime as  needed for severe agitation only  15 tablet  5  . sennosides-docusate sodium (SENOKOT-S) 8.6-50 MG tablet Take 1 tablet by mouth daily.       No current facility-administered medications on file prior to visit.   History reviewed. No pertinent family history.  Physical exam BP 148/72  Pulse 72  Temp(Src) 97.5 F (36.4 C)  Resp 18  Constitutional: She appears well-developed. No distress.   HENT:   Head: Normocephalic.   Mouth/Throat: Oropharynx is clear and moist.   Eyes: Conjunctivae are normal. Pupils are equal, round, and reactive to light.   Neck: Normal range of motion. Neck supple.   Cardiovascular: Normal rate, regular rhythm, normal heart sounds and intact distal pulses.    No murmur heard. Pulmonary/Chest: Effort normal and breath sounds normal.   Abdominal: Soft. Bowel sounds are normal. She exhibits no distension and no mass. There is no rebound.  Musculoskeletal: Normal range of motion. She exhibits no edema and no tenderness. Able to move all 4, propelled in wheelchair by others, fall risk  Skin: Skin is warm and dry. She is not diaphoretic.  Psychiatric:  Poor insight, poor judgement, dementia   Assessment/plan  Dementia with delusions- persists, off memory medications currently. Continue seroquel for now for delusions.Fall precautions. Skin care  Depression with dementia- continue celexa 20 mg daily and prn ativan at bedtime. She is also on depakote sprinkles  psuedobulbar affect- tolerating nudexta well, continue this and monitor  HTN- bp has been stable. Continue amlodipine 5 mg daily and lisinopril 5 mg daily  Constipation- continue snna-s with colace  Vitamin d def- continue oscal with vit d  Blanchie Serve, MD  [facility] [phone] (Monday-Friday 8 am - 5 pm) [phone] (afterhours)

## 2013-12-15 IMAGING — CT CT HEAD W/O CM
4 of 10 series · 14 of 47 positions shown, 16 images · non-contrast
Comparison: None

CLINICAL DATA: Fall, frontal laceration, confusion, history
hypertension, dementia

EXAM:
CT HEAD WITHOUT CONTRAST
CT CERVICAL SPINE WITHOUT CONTRAST
TECHNIQUE: Multidetector CT imaging of the head and cervical spine was
performed following the standard protocol without intravenous
contrast. Multiplanar CT image reconstructions of the cervical spine
were also generated.

[Series 10: coronals · coronal · 0.23mm/px · 3 of 61 slices shown]
[im 16/61  brain]
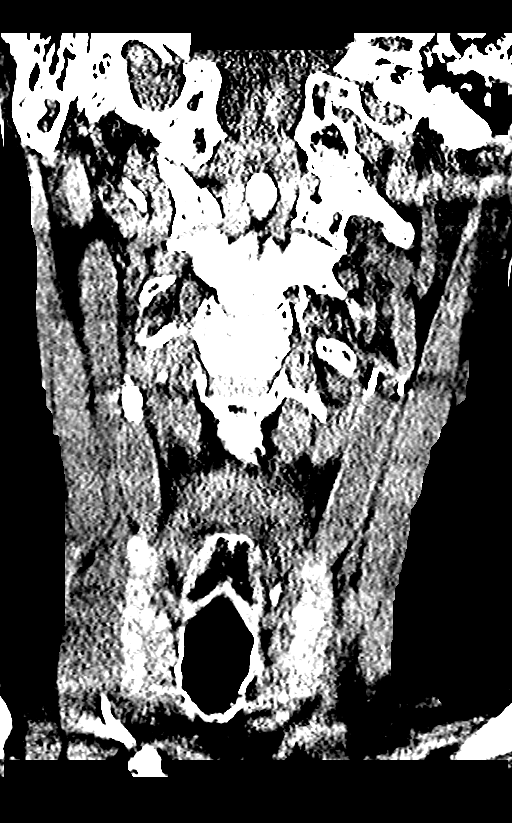
[im 31/61  brain]
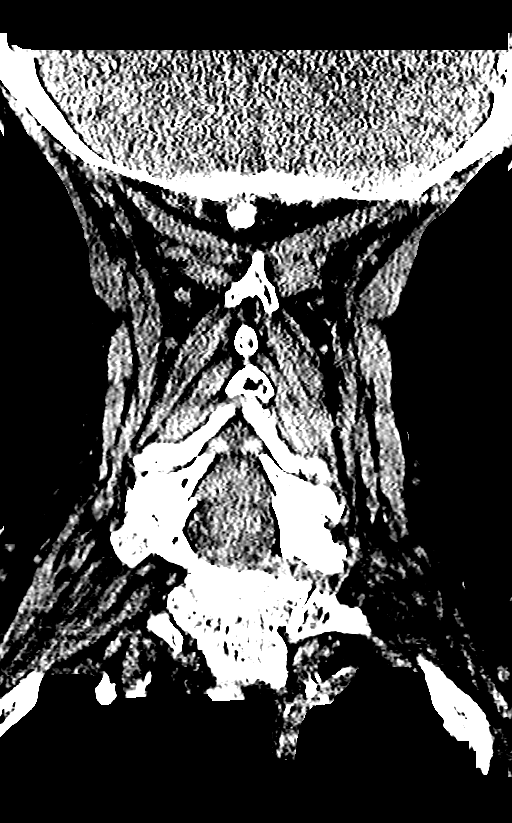
[im 46/61  brain]
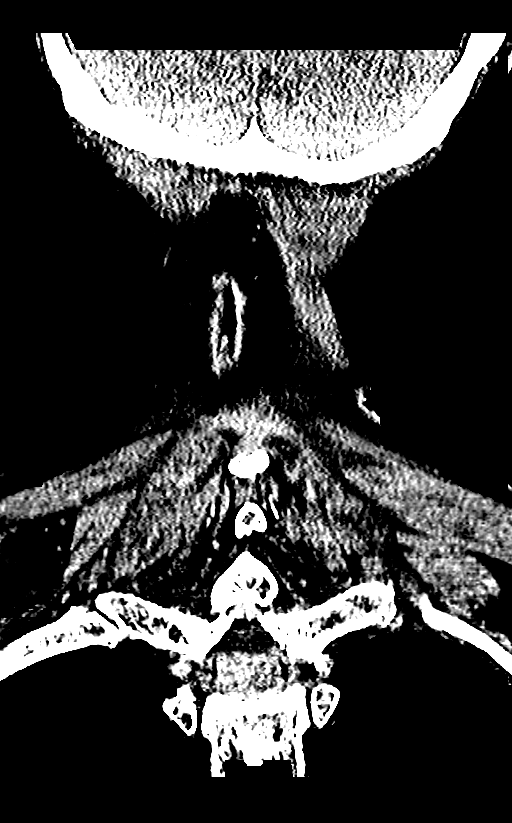

[Series 12: orthogonals · axial · 0.23mm/px · z∈[+1169,+1291]mm · 6 of 92 slices shown, 8 images (1 of 2)]
[im 14/92  brain]
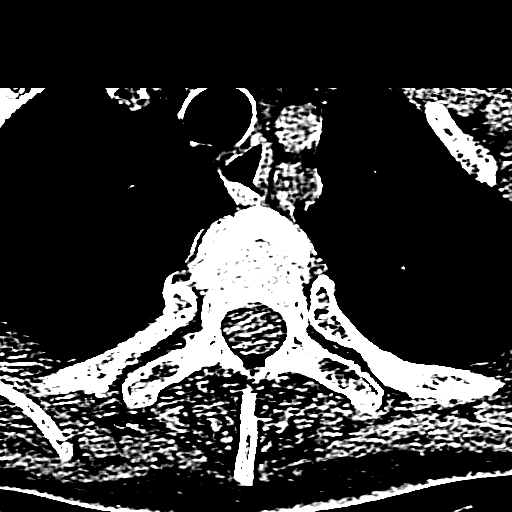
[im 14/92  bone]
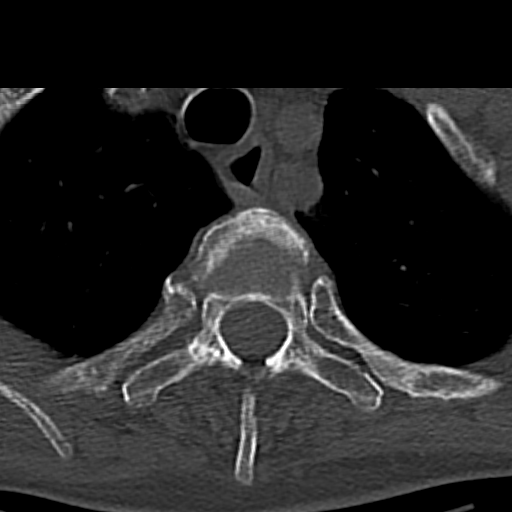
[im 27/92  brain]
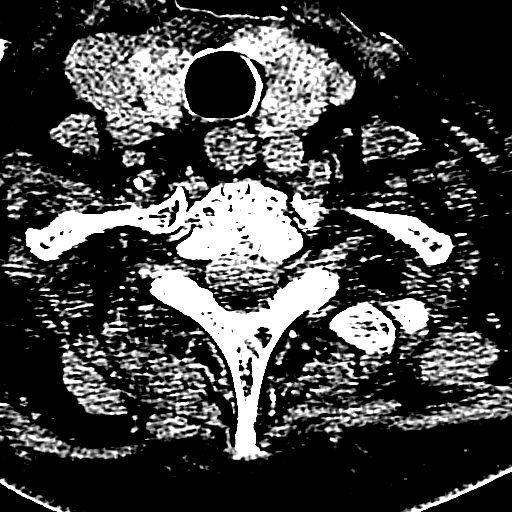
[im 40/92  brain]
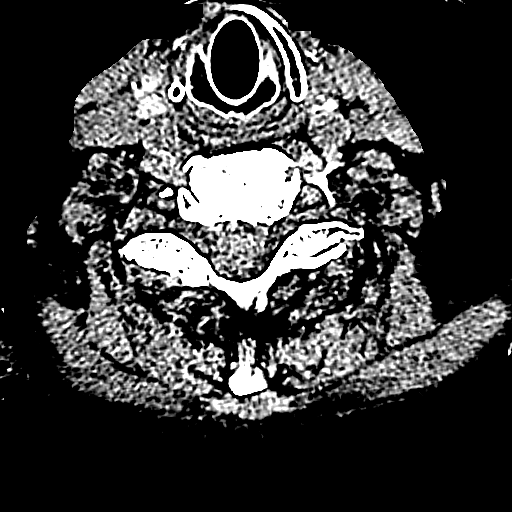
[im 53/92  brain]
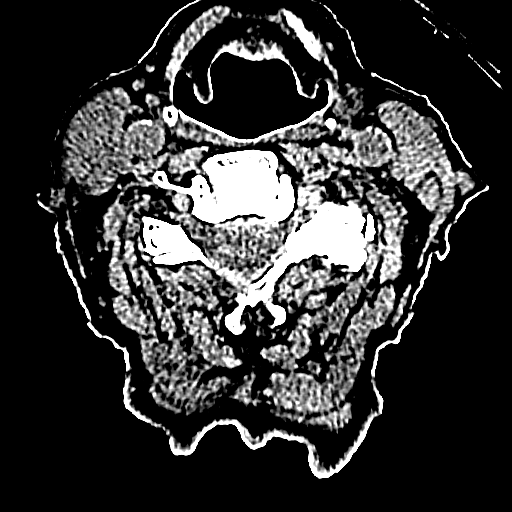
[im 66/92  brain]
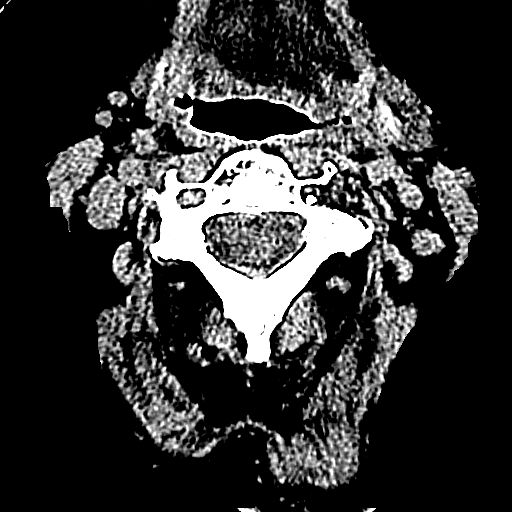
[im 66/92  bone]
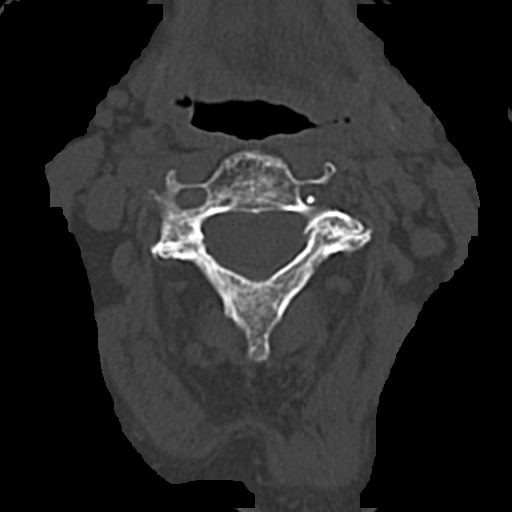
[im 79/92  brain]
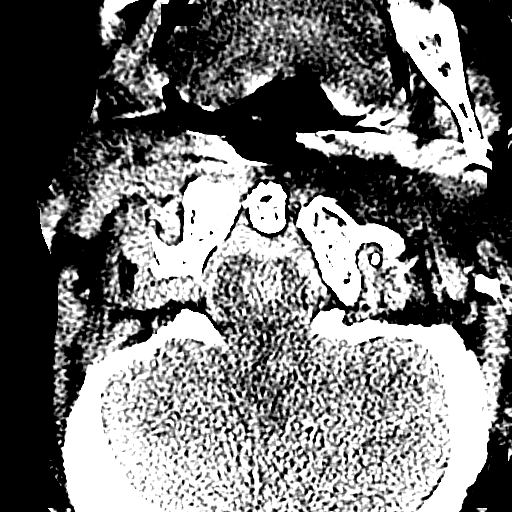

[Series 17: sagittals · sagittal · 0.23mm/px · 2 of 61 slices shown]
[im 21/61  brain]
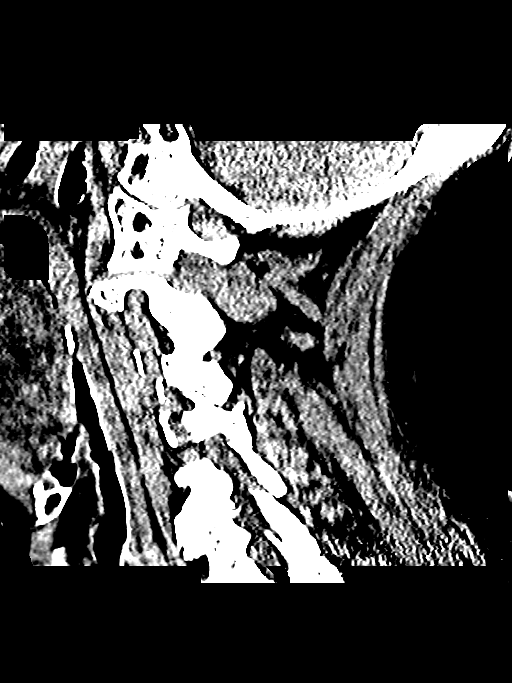
[im 41/61  brain]
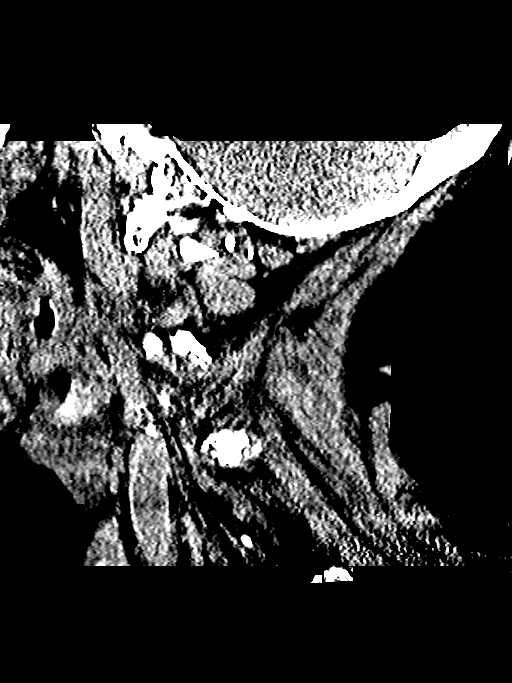

[Series 18: orthogonals · axial · 0.23mm/px · z∈[+1221,+1280]mm · 3 of 63 slices shown (2 of 2)]
[im 16/63  brain]
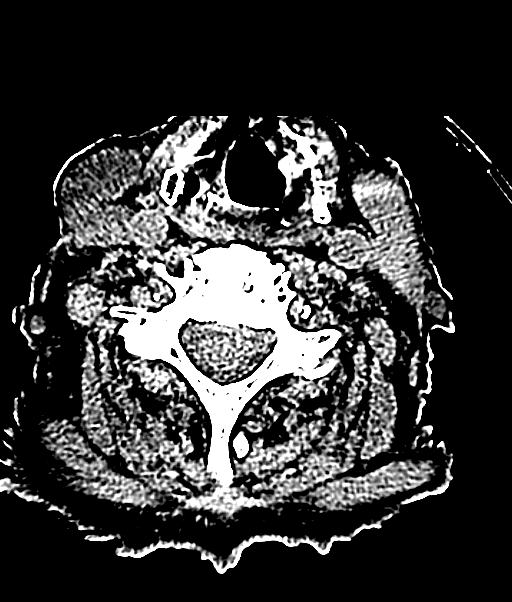
[im 32/63  brain]
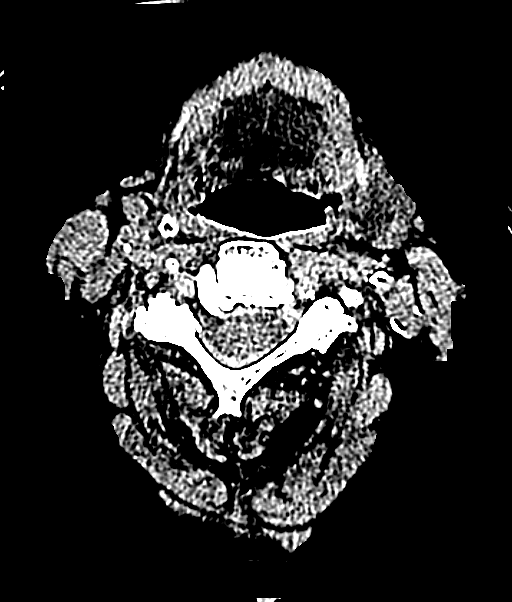
[im 47/63  brain]
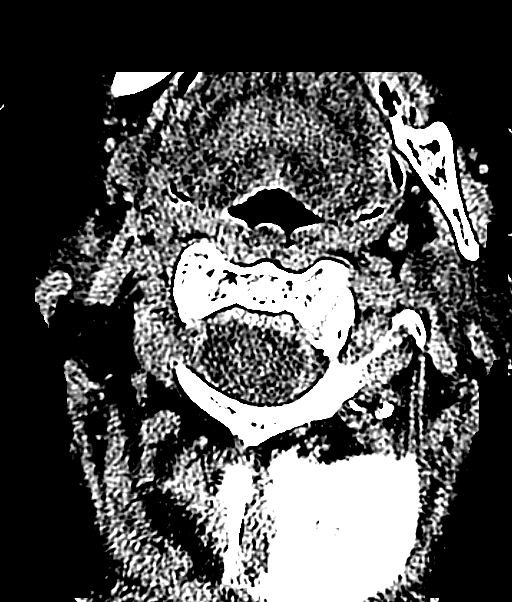

[14 of 47 positions shown; findings below may reference images not displayed]

FINDINGS: CT HEAD FINDINGS

Motion artifacts despite repeating images.

Generalized atrophy.

Normal ventricular morphology.

No midline shift or mass effect.

Small vessel chronic ischemic changes of deep cerebral white matter
greatest at left frontal region.

Old lacunar infarct at left external capsule.

Streak artifacts from skullbase.

Within limits of motion, no definite intracranial hemorrhage, mass
lesion, or evidence of acute infarction.

Hyperostosis frontalis interna.

Frontal scalp hematoma.

No gross evidence of calvarial fracture though examination is
limited by motion.

CT CERVICAL SPINE FINDINGS

Bones diffusely demineralized.

Skullbase intact.

Multilevel facet degenerative changes.

Disc space narrowing with endplate spur formation at C5-C6 and
C6-C7.

Prevertebral soft tissues normal thickness.

Minimal anterolisthesis at C6-C7 question related to facet disease.
No definite acute fracture, additional subluxation, or bone
destruction.

Numerous small nodules in both thyroid lobes.
IMPRESSION: CT HEAD IMPRESSION

Atrophy with small vessel chronic ischemic changes in deep cerebral
white matter.

Old infarct anterior aspect of left external capsule.

No definite acute intracranial abnormalities identified on
examination limited by motion artifacts.

CT CERVICAL SPINE IMPRESSION

Osseous demineralization with multilevel degenerative disc and facet
disease changes of the cervical spine.

No definite acute cervical spine abnormalities.

## 2014-01-06 ENCOUNTER — Non-Acute Institutional Stay (SKILLED_NURSING_FACILITY): Payer: PRIVATE HEALTH INSURANCE | Admitting: Internal Medicine

## 2014-01-06 DIAGNOSIS — F339 Major depressive disorder, recurrent, unspecified: Secondary | ICD-10-CM

## 2014-01-06 DIAGNOSIS — F22 Delusional disorders: Secondary | ICD-10-CM

## 2014-01-06 DIAGNOSIS — F0391 Unspecified dementia with behavioral disturbance: Secondary | ICD-10-CM

## 2014-01-06 DIAGNOSIS — F0390 Unspecified dementia without behavioral disturbance: Secondary | ICD-10-CM

## 2014-01-06 DIAGNOSIS — F03918 Unspecified dementia, unspecified severity, with other behavioral disturbance: Secondary | ICD-10-CM | POA: Insufficient documentation

## 2014-01-06 DIAGNOSIS — F0392 Unspecified dementia, unspecified severity, with psychotic disturbance: Secondary | ICD-10-CM

## 2014-01-06 NOTE — Progress Notes (Signed)
Patient ID: Morgan [name], female   DOB: [birth year], 80 y.o.   MRN: [id number]    [facility]  Code status- DNR  Chief complaint- medical management of chronic illness  Allergies- codeine  HPI 80 y/o female patient is here for long term care. She is at her baseline and has no concerns. She is followed by [facility] for depression, delusional disorder. She has dementia. She is a fall risk patient but no recent falls.     ROS Unable to obtain  Past Medical History  Diagnosis Date  . Hypertension   . Dementia   . Chronic kidney disease   . Cancer     HX OF BREAST CANCER  . Arthritis   . Macular degeneration   . Anxiety state, unspecified   . Unspecified constipation   . Osteoporosis, unspecified   . Abnormality of gait   . Fracture of neck of femur   . Peripheral vascular complications   . Personal history of fall   . Aftercare for healing traumatic fracture of hip   . Long term (current) use of anticoagulants   . Anemia   . Depression    Medication reviewed. See [facility]  Physical exam BP 120/65  Pulse 72  Temp(Src) 98.5 F (36.9 C)  Resp 18  Wt 125 lb (56.7 kg)  SpO2 96%  Constitutional: She appears well-developed. No distress.   HENT:   Head: Normocephalic.   Mouth/Throat: Oropharynx is clear and moist.   Eyes: Conjunctivae are normal. Pupils are equal, round, and reactive to light.   Neck: Normal range of motion. Neck supple.   Cardiovascular: Normal rate, regular rhythm, normal heart sounds and intact distal pulses.    No murmur heard. Pulmonary/Chest: Effort normal and breath sounds normal.   Abdominal: Soft. Bowel sounds are normal. She exhibits no distension and no mass. There is no rebound.  Musculoskeletal: Normal range of motion. She exhibits no edema and no tenderness. Able to move all 4, propelled in wheelchair by others, fall risk  Skin: Skin is warm and dry. She is not diaphoretic.   Psychiatric:  Poor insight, poor judgement, dementia    Assessment/plan  Delusional disorder Ongoing. Continue nudexta with seoquel and depakote for now. depakote dose recently adjusted and on 750 mg in am and 500 mg in pm. Monitor mood and behavior. Monitor depakote level to assess for toxicity  Senile dementia with behavioral disturbance Off memory medications. Decline anticipated. Continue bowel regimen and monitor bowel movement. Continue skin care, monitor po intake and to provide fall precautions  Depression with dementia continue celexa 20 mg daily and prn ativan at bedtime. She is also on depakote sprinkles  Blanchie Serve, MD  [facility] [phone] (Monday-Friday 8 am - 5 pm) [phone] (afterhours)

## 2014-01-31 ENCOUNTER — Non-Acute Institutional Stay (SKILLED_NURSING_FACILITY): Payer: PRIVATE HEALTH INSURANCE | Admitting: Internal Medicine

## 2014-01-31 DIAGNOSIS — F22 Delusional disorders: Secondary | ICD-10-CM

## 2014-01-31 DIAGNOSIS — I1310 Hypertensive heart and chronic kidney disease without heart failure, with stage 1 through stage 4 chronic kidney disease, or unspecified chronic kidney disease: Secondary | ICD-10-CM

## 2014-01-31 DIAGNOSIS — D72819 Decreased white blood cell count, unspecified: Secondary | ICD-10-CM

## 2014-01-31 DIAGNOSIS — N189 Chronic kidney disease, unspecified: Secondary | ICD-10-CM

## 2014-02-21 ENCOUNTER — Non-Acute Institutional Stay (SKILLED_NURSING_FACILITY): Payer: PRIVATE HEALTH INSURANCE | Admitting: Internal Medicine

## 2014-02-21 DIAGNOSIS — F0390 Unspecified dementia without behavioral disturbance: Secondary | ICD-10-CM | POA: Insufficient documentation

## 2014-02-21 DIAGNOSIS — F22 Delusional disorders: Secondary | ICD-10-CM

## 2014-02-21 DIAGNOSIS — F339 Major depressive disorder, recurrent, unspecified: Secondary | ICD-10-CM

## 2014-02-21 DIAGNOSIS — F0392 Unspecified dementia, unspecified severity, with psychotic disturbance: Secondary | ICD-10-CM

## 2014-02-21 DIAGNOSIS — I10 Essential (primary) hypertension: Secondary | ICD-10-CM

## 2014-02-21 NOTE — Progress Notes (Signed)
Patient ID: Morgan [name], female   DOB: 08-08-1927, 80 y.o.   MRN: [id number]    [place]  Chief Complaint  Patient presents with  . Medical Management of Chronic Issues    senile dementia with behavioral disturbance, depression, delusional disorder, htn   Allergies  Allergen Reactions  . Codeine Nausea And Vomiting    Code Status- DNR  HPI 80 y/o female patient is here for long term care. She is at her baseline and is yelling out for some black coffee. She is followed by [facility] for depression, delusional disorder. She has dementia. She is a fall risk patient but no recent falls. Weight stable. No skin issues. No concern from staff. Elevated SBP today but on review average bp readings < 140/90  ROS Unable to obtain  Past Medical History  Diagnosis Date  . Hypertension   . Dementia   . Chronic kidney disease   . Cancer     HX OF BREAST CANCER  . Arthritis   . Macular degeneration   . Anxiety state, unspecified   . Unspecified constipation   . Osteoporosis, unspecified   . Abnormality of gait   . Fracture of neck of femur   . Peripheral vascular complications   . Personal history of fall   . Aftercare for healing traumatic fracture of hip   . Long term (current) use of anticoagulants   . Anemia   . Depression    Outpatient Encounter Prescriptions as of 02/21/2014  Medication Sig  . acetaminophen (TYLENOL) 325 MG tablet Take 650 mg by mouth 2 (two) times daily at 10 AM and 5 PM.   . amLODipine (NORVASC) 10 MG tablet Take 5 mg by mouth daily.   Marland Kitchen aspirin 81 MG chewable tablet Chew 81 mg by mouth daily.  . bisacodyl (DULCOLAX) 10 MG suppository Place 10 mg rectally 2 (two) times daily as needed for moderate constipation.  . calcium-vitamin D (OSCAL WITH D) 500-200 MG-UNIT per tablet Take 1 tablet by mouth daily with breakfast.  . Cholecalciferol (VITAMIN D3) 2000 UNITS TABS Take 1 capsule by mouth daily.  . citalopram (CELEXA) 20 MG tablet Take  20 mg by mouth daily.  Marland Kitchen Dextromethorphan-Quinidine (NUEDEXTA) 20-10 MG CAPS Take 1 capsule by mouth 2 (two) times daily.  Marland Kitchen docusate sodium (COLACE) 100 MG capsule Take 100 mg by mouth 2 (two) times daily as needed for constipation.  . ferrous sulfate 325 (65 FE) MG tablet Take 325 mg by mouth daily with breakfast.  . lisinopril (PRINIVIL,ZESTRIL) 5 MG tablet Take 5 mg by mouth daily.  Marland Kitchen LORazepam (ATIVAN) 0.5 MG tablet Take 1/2 tablet by mouth every night at bedtime as needed for severe agitation only  . Melatonin 3 MG CAPS Take 3 mg by mouth daily after supper.  . sennosides-docusate sodium (SENOKOT-S) 8.6-50 MG tablet Take 1 tablet by mouth daily.  . [DISCONTINUED] divalproex (DEPAKOTE SPRINKLE) 125 MG capsule Take 500 mg by mouth 2 (two) times daily.  . [DISCONTINUED] magnesium hydroxide (MILK OF MAGNESIA) 800 MG/5ML suspension Take 45 mLs by mouth daily as needed for constipation.  . [DISCONTINUED] QUEtiapine (SEROQUEL) 25 MG tablet Take 25 mg by mouth at bedtime.   Physical Exam:  BP 155/75  Pulse 76  Temp(Src) 99.2 F (37.3 C)  Resp 18  Ht 5\' 5"  (1.651 m)  Wt 121 lb 12.8 oz (55.248 kg)  BMI 20.27 kg/m2  SpO2 97%  Constitutional: She appears well-developed, well-nourished. Elderly female in  no acute distress Head: Normocephalic. Atraumatic  Mouth/Throat: Oropharynx is clear and moist.   Eyes: Conjunctivae are normal. Pupils are equal, round, and reactive to light. EOM intact   Neck: Normal range of motion. Neck supple. No carotid bruit. No JVD Cardiovascular: Normal rate, regular rhythm. 2/6 soft systolic murmur. Pulses intact    Pulmonary/Chest: Unlabored respiratory effort. CTA bilaterally. No crackles, wheezing, rhonchi Abdominal: Soft. Bowel sounds present. No tenderness.  Musculoskeletal: Normal range of motion. She exhibits no edema and no tenderness. Able to move all 4, propelled in wheelchair by others, fall risk  Skin: Skin is warm and dry. She is not diaphoretic.     Psychiatric: Poor insight, poor judgement, demented.    Labs: 01/11/14 na 138, k 4.3, glu 77, bun 25, cr 0.7, ca 8.3 02/03/14 wbc 2.2, hb 11.7, hct 35.6, mcv 94.4, plt 154  Assessment/Plan:   1. HTN (hypertension) Last BP slightly elevated. SBP usually <140. Continue norvasc 5mg  qd and lisinopril 5mg  qd. Continue to monitor BP. Will increase lisinopril if SBP consistently > 135mmHg  2. Delusional disorder Ongoing. Continue Nuedexta. Monitor mood and behavior.   3. Senile dementia with delusional features Decline anticipated. Continue bowel regimen and monitor bowel movement. Continue skin care, fall precautions, monitor po intake.   4. Major depressive disorder, recurrent episode, unspecified Stable. Continue celexa 20mg  qd  Plan of care discuss with nursing staff. Nursing staff verbalize understanding and agree with plan of care

## 2014-03-16 ENCOUNTER — Non-Acute Institutional Stay (SKILLED_NURSING_FACILITY): Payer: PRIVATE HEALTH INSURANCE | Admitting: Internal Medicine

## 2014-03-16 DIAGNOSIS — M79609 Pain in unspecified limb: Secondary | ICD-10-CM

## 2014-03-16 DIAGNOSIS — S40029A Contusion of unspecified upper arm, initial encounter: Secondary | ICD-10-CM

## 2014-03-16 DIAGNOSIS — S40022A Contusion of left upper arm, initial encounter: Secondary | ICD-10-CM

## 2014-03-16 DIAGNOSIS — M79602 Pain in left arm: Secondary | ICD-10-CM

## 2014-03-17 LAB — POCT INR: INR: 1 (ref <=1.1)

## 2014-03-17 LAB — PROTIME-INR: Protime: 10.4 seconds (ref 10.0–13.8)

## 2014-04-07 ENCOUNTER — Non-Acute Institutional Stay (SKILLED_NURSING_FACILITY): Payer: PRIVATE HEALTH INSURANCE | Admitting: Internal Medicine

## 2014-04-07 DIAGNOSIS — I739 Peripheral vascular disease, unspecified: Secondary | ICD-10-CM

## 2014-04-07 DIAGNOSIS — F29 Unspecified psychosis not due to a substance or known physiological condition: Secondary | ICD-10-CM

## 2014-04-07 DIAGNOSIS — F0390 Unspecified dementia without behavioral disturbance: Secondary | ICD-10-CM

## 2014-04-07 DIAGNOSIS — D649 Anemia, unspecified: Secondary | ICD-10-CM

## 2014-04-07 DIAGNOSIS — E43 Unspecified severe protein-calorie malnutrition: Secondary | ICD-10-CM | POA: Insufficient documentation

## 2014-04-07 NOTE — Progress Notes (Signed)
Patient ID: Morgan [name], female   DOB: 1927/04/16, 80 y.o.   MRN: [id number]    [facility]  Code status- DNR  Chief complaint- medical management of chronic illness  Allergies- codeine  HPI 80 y/o female patient is here for long term care. She is at her baseline, has advanced dementia and has no concerns. She is followed by [facility] for depression, delusional disorder.  She is a fall risk patient but no recent falls. tolerating exelon patch. Has lost weight.   ROS Unable to obtain  Past Medical History  Diagnosis Date  . Hypertension   . Dementia   . Chronic kidney disease   . Cancer     HX OF BREAST CANCER  . Arthritis   . Macular degeneration   . Anxiety state, unspecified   . Unspecified constipation   . Osteoporosis, unspecified   . Abnormality of gait   . Fracture of neck of femur   . Peripheral vascular complications   . Personal history of fall   . Aftercare for healing traumatic fracture of hip   . Long term (current) use of anticoagulants   . Anemia   . Depression    Current Outpatient Prescriptions on File Prior to Visit  Medication Sig Dispense Refill  . acetaminophen (TYLENOL) 325 MG tablet Take 650 mg by mouth 2 (two) times daily at 10 AM and 5 PM.       . amLODipine (NORVASC) 10 MG tablet Take 5 mg by mouth daily.       Marland Kitchen aspirin 81 MG chewable tablet Chew 81 mg by mouth daily.      . bisacodyl (DULCOLAX) 10 MG suppository Place 10 mg rectally 2 (two) times daily as needed for moderate constipation.      . calcium-vitamin D (OSCAL WITH D) 500-200 MG-UNIT per tablet Take 1 tablet by mouth daily with breakfast.      . Cholecalciferol (VITAMIN D3) 2000 UNITS TABS Take 1 capsule by mouth daily.      . citalopram (CELEXA) 20 MG tablet Take 20 mg by mouth daily.      Marland Kitchen Dextromethorphan-Quinidine (NUEDEXTA) 20-10 MG CAPS Take 1 capsule by mouth 2 (two) times daily.      Marland Kitchen docusate sodium (COLACE) 100 MG capsule Take 100 mg by mouth 2 (two) times  daily as needed for constipation.      . ferrous sulfate 325 (65 FE) MG tablet Take 325 mg by mouth daily with breakfast.      . lisinopril (PRINIVIL,ZESTRIL) 5 MG tablet Take 5 mg by mouth daily.      Marland Kitchen LORazepam (ATIVAN) 0.5 MG tablet Take 1/2 tablet by mouth every night at bedtime as needed for severe agitation only  15 tablet  5  . Melatonin 3 MG CAPS Take 3 mg by mouth daily after supper.      . sennosides-docusate sodium (SENOKOT-S) 8.6-50 MG tablet Take 1 tablet by mouth daily.       No current facility-administered medications on file prior to visit.   Physical exam BP 123/88  Pulse 78  Temp(Src) 97.8 F (36.6 C)  Resp 20  Wt 116 lb 3.2 oz (52.708 kg)  SpO2 95%  Constitutional: She appears well-developed. No distress.   HENT:   Head: Normocephalic.   Mouth/Throat: Oropharynx is clear and moist.   Eyes: Conjunctivae are normal. Pupils are equal, round, and reactive to light.   Neck: Normal range of motion. Neck supple.   Cardiovascular: Normal  rate, regular rhythm, normal heart sounds and intact distal pulses.    No murmur heard. Pulmonary/Chest: Effort normal and breath sounds normal.   Abdominal: Soft. Bowel sounds are normal. She exhibits no distension and no mass. There is no rebound.  Musculoskeletal: Normal range of motion. geri sleeves in place and ted hose in place. She exhibits no edema and no tenderness. Able to move all 4, propelled in wheelchair by others, fall risk  Skin: Skin is warm and dry. She is not diaphoretic.   Psychiatric:  Poor insight, poor judgement, dementia   Labs- 03/17/14 wbc 2.8, hb 10.6, hct 33.5, plt 163, inr 1  Assessment/plan  Anemia Continue ferrous sulfate 325 mg daily for now  PVD Off aspirin now. Skin care and pressure sore prevention. Monitor clinically  Dementia  persists, continue exelon patch and bowel regimen. Continue assistance with ADLs, skin care, fall precautions and monitor weight  Psychosis Ongoing, with her  progressive dementia, decline anticipated. Off nudexta, depakote and seroquel.   Protein calorie malnutrition Weight loss persists, decline anticipated. Monitor and continue nutritional supplement. Skin care.   Blanchie Serve, MD  [facility] [phone] (Monday-Friday 8 am - 5 pm) [phone] (afterhours)

## 2014-04-07 NOTE — Progress Notes (Signed)
Patient ID: Morgan [name], female   DOB: 1927-03-07, 80 y.o.   MRN: [id number]    [facility]  Code status- DNR  Chief complaint- medical management of chronic illness  Allergies- codeine  HPI 80 y/o female patient is here for long term care. She has dementia. She is a fall risk patient but no recent falls.     ROS Unable to obtain from pt No new concern from staff  Medication reviewed. See MAR  Physical exam Vss, afebrile  Constitutional: She appears well-developed. No distress.   Cardiovascular: Normal rate, regular rhythm, normal heart sounds and intact distal pulses.    No murmur heard. Pulmonary/Chest: Effort normal and breath sounds normal.   Abdominal: Soft. Bowel sounds are normal. She exhibits no distension and no mass. There is no rebound.  Musculoskeletal: Normal range of motion. She exhibits no edema and no tenderness. Able to move all 4, propelled in wheelchair by others, fall risk  Skin: Skin is warm and dry. She is not diaphoretic.   Psychiatric: Poor insight, poor judgement, dementia   Assessment/plan  HTN Continue her norvasc 5 mg daily and lisinopril current regimen 5 mg daily  Dementia with delusions persists, off memory medications currently. off depakote and off seroquel. Monitor   Leukopenia Thought to be from her psych meds, off depakote and seroquel. Reassess cbc  ckd Continue lisinopril and monitor renal function

## 2014-04-07 NOTE — Progress Notes (Signed)
Patient ID: Morgan [name], female   DOB: 1927/05/13, 80 y.o.   MRN: [id number]    [facility]  Chief Complaint  Patient presents with  . Acute Visit    left arm bruise   Allergies  Allergen Reactions  . Codeine Nausea And Vomiting   HPI 80 y/o female pt is seen today for acute concerns. Staff noticed that this morning, she had bruise in her left arm. She denies any pain. With her progressive dementia, hpi and ROS limited. She is in no distress. No witnessed trauma/ fall as per staff. Pt is on aspirin  ROS As per staff No new meds No falls reported Appetite fair No skin concerns elsewhere No acute behavior changes  Past Medical History  Diagnosis Date  . Hypertension   . Dementia   . Chronic kidney disease   . Cancer     HX OF BREAST CANCER  . Arthritis   . Macular degeneration   . Anxiety state, unspecified   . Unspecified constipation   . Osteoporosis, unspecified   . Abnormality of gait   . Fracture of neck of femur   . Peripheral vascular complications   . Personal history of fall   . Aftercare for healing traumatic fracture of hip   . Long term (current) use of anticoagulants   . Anemia   . Depression    Medication reviewed. See [facility]  Physical exam BP 130/60  Pulse 78  Temp(Src) 98 F (36.7 C)  Resp 18  Constitutional: She appears well-developed. No distress.   HENT:   Head: Normocephalic.   Mouth/Throat: Oropharynx is clear and moist.   Eyes: Conjunctivae are normal.  Neck: Normal range of motion. Neck supple.   Cardiovascular: Normal rate, regular rhythm, normal heart sounds and intact distal pulses.    No murmur heard. Pulmonary/Chest: Effort normal and breath sounds normal.   Musculoskeletal: bruise in left arm with extension to lateral part of left breast, no palpable axillary or cervical lymph node, has thin skin and easy bruising. Mild tenderness on palpation on the arm, shoulder. No tenderness on chest area Skin: Skin is warm and  dry. She is not diaphoretic.   Psychiatric: Poor insight, poor judgement, dementia   Assessment/plan  Left arm pain and bruise Unclear of the cause. New finding. Will get xray left arm and shoulder to rule out ? Fracture. Will hold aspirin for now and check cbc and coagulation profile. Pt has dementia and could have hurt herself against the wall while turning positions leading to bleeding. Monitor clinically

## 2014-04-18 LAB — CBC AND DIFFERENTIAL
HEMATOCRIT: 33 % — AB (ref 36–46)
HEMOGLOBIN: 10.4 g/dL — AB (ref 12.0–16.0)
PLATELETS: 153 10*3/uL (ref 150–399)
WBC: 2 10*3/mL

## 2014-04-18 LAB — BASIC METABOLIC PANEL
BUN: 23 mg/dL — AB (ref 4–21)
Glucose: 90 mg/dL
POTASSIUM: 4.3 mmol/L (ref 3.4–5.3)
Sodium: 138 mmol/L (ref 137–147)

## 2014-04-18 LAB — HEPATIC FUNCTION PANEL
ALT: 6 U/L — AB (ref 7–35)
AST: 13 U/L (ref 13–35)
Alkaline Phosphatase: 48 U/L (ref 25–125)
BILIRUBIN, TOTAL: 0.6 mg/dL

## 2014-05-02 ENCOUNTER — Encounter: Payer: Self-pay | Admitting: Internal Medicine

## 2014-05-02 ENCOUNTER — Non-Acute Institutional Stay (SKILLED_NURSING_FACILITY): Payer: PRIVATE HEALTH INSURANCE | Admitting: Internal Medicine

## 2014-05-02 DIAGNOSIS — N189 Chronic kidney disease, unspecified: Secondary | ICD-10-CM

## 2014-05-02 DIAGNOSIS — I1310 Hypertensive heart and chronic kidney disease without heart failure, with stage 1 through stage 4 chronic kidney disease, or unspecified chronic kidney disease: Secondary | ICD-10-CM

## 2014-05-02 DIAGNOSIS — D72819 Decreased white blood cell count, unspecified: Secondary | ICD-10-CM

## 2014-05-02 NOTE — Progress Notes (Signed)
Patient ID: Morgan [name], female   DOB: 05-15-1927, 80 y.o.   MRN: [id number]  [facility]  Provider: Blanchie Serve, MD  Code Status:  DNR  Chief Complaint  Patient presents with  . Medical Management of Chronic Issues   HPI 80 y/o female patient is here for long term care. She has dementia. No new concern from staff. Has confusion and delirium at baseline  ROS Unable to obtain  Medications: Patient's Medications  New Prescriptions   No medications on file  Previous Medications   ACETAMINOPHEN (TYLENOL) 325 MG TABLET    Take 650 mg by mouth 2 (two) times daily at 10 AM and 5 PM.    AMLODIPINE (NORVASC) 10 MG TABLET    Take 5 mg by mouth daily. Take 1/2 tablet equals 5 mg, for HTN.   BISACODYL (DULCOLAX) 10 MG SUPPOSITORY    Place 10 mg rectally 2 (two) times daily as needed for moderate constipation.   CALCIUM-VITAMIN D (OSCAL WITH D) 500-200 MG-UNIT PER TABLET    Take 1 tablet by mouth daily with breakfast.   CHOLECALCIFEROL (VITAMIN D3) 2000 UNITS TABS    Take 1 capsule by mouth daily.   CITALOPRAM (CELEXA) 20 MG TABLET    Take 20 mg by mouth daily. For depression/ anxiety.   DEXTROMETHORPHAN-QUINIDINE (NUEDEXTA) 20-10 MG CAPS    Take 1 capsule by mouth 2 (two) times daily.   DOCUSATE SODIUM (COLACE) 100 MG CAPSULE    Take 100 mg by mouth 2 (two) times daily as needed for constipation.   FERROUS SULFATE 325 (65 FE) MG TABLET    Take 325 mg by mouth daily with breakfast.   LISINOPRIL (PRINIVIL,ZESTRIL) 5 MG TABLET    Take 5 mg by mouth daily. For HTN   LORAZEPAM (ATIVAN) 0.5 MG TABLET    Take 1/2 tablet by mouth every night at bedtime as needed for severe agitation only   MELATONIN 3 MG CAPS    Take 3 mg by mouth daily after supper.   SENNOSIDES-DOCUSATE SODIUM (SENOKOT-S) 8.6-50 MG TABLET    Take 1 tablet by mouth daily.  Modified Medications   No medications on file  Discontinued Medications   ASPIRIN 81 MG CHEWABLE TABLET    Chew 81 mg by mouth  daily.    Physical Exam: Filed Vitals:   05/02/14 1311  BP: 119/63  Pulse: 86  Temp: 97.2 F (36.2 C)  Resp: 18  Height: 5\' 5"  (1.651 m)  Weight: 116 lb 3.2 oz (52.708 kg)   Constitutional: She appears well-developed. No distress.   Cardiovascular: Normal rate, regular rhythm, normal heart sounds and intact distal pulses.    No murmur heard. Pulmonary/Chest: Effort normal and breath sounds normal.   Abdominal: Soft. Bowel sounds are normal. She exhibits no distension and no mass. There is no rebound.  Musculoskeletal: Normal range of motion. She exhibits no edema and no tenderness. Able to move all 4, propelled in wheelchair by others, fall risk  Skin: Skin is warm and dry. She is not diaphoretic.   Psychiatric:  Poor insight, poor judgement, dementia    Labs reviewed: Basic Metabolic Panel:  Recent Labs  04/18/14  NA 138  K 4.3  BUN 23*    Liver Function Tests:  Recent Labs  04/18/14  AST 13  ALT 6*  ALKPHOS 48    CBC:  Recent Labs  04/18/14  WBC 2.0  HGB 10.4*  HCT 33*  PLT 153  Assessment/Plan  ckd Continue ACEI, monitor clinically  Leukocytopenia Unclear etiology, off depakote, monitor clinicaly, family does not want hematology consultation  HTN Stable, continue lisinopril and norvasc

## 2014-06-02 ENCOUNTER — Non-Acute Institutional Stay (SKILLED_NURSING_FACILITY): Payer: PRIVATE HEALTH INSURANCE | Admitting: Internal Medicine

## 2014-06-02 ENCOUNTER — Encounter: Payer: Self-pay | Admitting: Internal Medicine

## 2014-06-02 DIAGNOSIS — IMO0001 Reserved for inherently not codable concepts without codable children: Secondary | ICD-10-CM | POA: Insufficient documentation

## 2014-06-02 DIAGNOSIS — F0393 Unspecified dementia, unspecified severity, with mood disturbance: Secondary | ICD-10-CM

## 2014-06-02 DIAGNOSIS — F0280 Dementia in other diseases classified elsewhere without behavioral disturbance: Secondary | ICD-10-CM

## 2014-06-02 DIAGNOSIS — N189 Chronic kidney disease, unspecified: Secondary | ICD-10-CM | POA: Insufficient documentation

## 2014-06-02 DIAGNOSIS — F329 Major depressive disorder, single episode, unspecified: Secondary | ICD-10-CM

## 2014-06-02 DIAGNOSIS — I10 Essential (primary) hypertension: Secondary | ICD-10-CM

## 2014-06-02 DIAGNOSIS — D509 Iron deficiency anemia, unspecified: Secondary | ICD-10-CM

## 2014-06-02 DIAGNOSIS — D72819 Decreased white blood cell count, unspecified: Secondary | ICD-10-CM | POA: Insufficient documentation

## 2014-06-02 DIAGNOSIS — F3289 Other specified depressive episodes: Secondary | ICD-10-CM

## 2014-06-02 NOTE — Progress Notes (Signed)
Patient ID: Morgan [name], female   DOB: 06/03/1927, 80 y.o.   MRN: [id number]    Morgan [name]  Code status- DNR  Chief complaint- medical management of chronic illness  Allergies- codeine  HPI 80 y/o female patient is here for long term [name]. She is at her baseline with dementia and has no concerns. She is followed by [facility] for depression, delusional disorder.   ROS Unable to obtain from patient No falls reported No new skin concern Ongoing confusion and delusions  Current Outpatient Prescriptions on File Prior to Visit  Medication Sig Dispense Refill  . acetaminophen (TYLENOL) 325 MG tablet Take 650 mg by mouth 2 (two) times daily at 10 AM and 5 PM.       . amLODipine (NORVASC) 10 MG tablet Take 5 mg by mouth daily. Take 1/2 tablet equals 5 mg, for HTN.      . bisacodyl (DULCOLAX) 10 MG suppository Place 10 mg rectally 2 (two) times daily as needed for moderate constipation.      . calcium-vitamin D (OSCAL WITH D) 500-200 MG-UNIT per tablet Take 1 tablet by mouth daily with breakfast.      . Cholecalciferol (VITAMIN D3) 2000 UNITS TABS Take 1 capsule by mouth daily.      . citalopram (CELEXA) 20 MG tablet Take 20 mg by mouth daily. For depression/ anxiety.      . Dextromethorphan-Quinidine (NUEDEXTA) 20-10 MG CAPS Take 1 capsule by mouth 2 (two) times daily.      Marland Kitchen docusate sodium (COLACE) 100 MG capsule Take 100 mg by mouth 2 (two) times daily as needed for constipation.      . ferrous sulfate 325 (65 FE) MG tablet Take 325 mg by mouth daily with breakfast.      . lisinopril (PRINIVIL,ZESTRIL) 5 MG tablet Take 5 mg by mouth daily. For HTN      . LORazepam (ATIVAN) 0.5 MG tablet Take 1/2 tablet by mouth every night at bedtime as needed for severe agitation only  15 tablet  5  . Melatonin 3 MG CAPS Take 3 mg by mouth daily after supper.      . sennosides-docusate sodium (SENOKOT-S) 8.6-50 MG tablet Take 1 tablet by mouth daily.       No current facility-administered  medications on file prior to visit.   Physical exam BP 158/83  Pulse 78  Temp(Src) 99.3 F (37.4 C)  Resp 18  SpO2 96%  Constitutional: She appears well-developed. No distress.   HENT:   Head: Normocephalic.   Mouth/Throat: Oropharynx is clear and moist.   Neck: Normal range of motion. Neck supple.   Cardiovascular: Normal rate, regular rhythm, normal heart sounds and intact distal pulses.    No murmur heard. Pulmonary/Chest: Effort normal and breath sounds normal.   Abdominal: Soft. Bowel sounds are normal. She exhibits no distension and no mass. There is no rebound.  Musculoskeletal: Normal range of motion. She exhibits no edema and no tenderness. Able to move all 4, propelled in wheelchair by others, fall risk  Skin: Skin is warm and dry. She is not diaphoretic.   Psychiatric:  Poor insight, poor judgement, dementia   Labs- 03/17/14 wbc 2.8, hb 10.6, hct 33.5, plt 163, inr 1 04/18/14 wbc 2, hb 10.4, hct 33.3, plt 153, na 138, k 4.3, bun 23, cr 0.9, glu 90, ca 8.4  Assessment/plan  Depression with dementia continue celexa 20 mg daily and prn ativan at bedtime. She is also on depakote sprinkles  HTN bp elevated today. Pt is calm. On norvac 5 mg daily and lisinopril 5 mg daily. Will increase lisinopril to 10 mg daily and monitor bp readings   Iron def anemia Stable h&h. Continue ferrous sulfate

## 2014-07-04 ENCOUNTER — Non-Acute Institutional Stay (SKILLED_NURSING_FACILITY): Payer: PRIVATE HEALTH INSURANCE | Admitting: Internal Medicine

## 2014-07-04 ENCOUNTER — Encounter: Payer: Self-pay | Admitting: Internal Medicine

## 2014-07-04 DIAGNOSIS — F01518 Vascular dementia, unspecified severity, with other behavioral disturbance: Secondary | ICD-10-CM

## 2014-07-04 DIAGNOSIS — F0280 Dementia in other diseases classified elsewhere without behavioral disturbance: Secondary | ICD-10-CM

## 2014-07-04 DIAGNOSIS — F0393 Unspecified dementia, unspecified severity, with mood disturbance: Secondary | ICD-10-CM

## 2014-07-04 DIAGNOSIS — F329 Major depressive disorder, single episode, unspecified: Secondary | ICD-10-CM

## 2014-07-04 DIAGNOSIS — I10 Essential (primary) hypertension: Secondary | ICD-10-CM

## 2014-07-04 DIAGNOSIS — F0151 Vascular dementia with behavioral disturbance: Secondary | ICD-10-CM

## 2014-07-04 NOTE — Progress Notes (Signed)
Patient ID: Morgan [name], female   DOB: Aug 08, 1927, 80 y.o.   MRN: [id number]   Place of Service: [facility]  Allergies  Allergen Reactions  . Codeine Nausea And Vomiting    Code Status: DNR  Goals of Care: Comfort and Quality of Life/Long term care  Chief Complaint  Patient presents with  . Medical Management of Chronic Issues    HTN, dementia, depression    HPI 80 y.o. female with PMH of dementia, htn, depression among others is being seen for a routine visit. No falls or skin issues reported. Weight stable. No change in behaviors or functional status reported. No concerns from nursing staff.   Review of Systems Unable to obtained due to dementia.   Past Medical History  Diagnosis Date  . Hypertension   . Dementia   . Chronic kidney disease   . Cancer     HX OF BREAST CANCER  . Arthritis   . Macular degeneration   . Anxiety state, unspecified   . Unspecified constipation   . Osteoporosis, unspecified   . Abnormality of gait   . Fracture of neck of femur   . Peripheral vascular complications   . Personal history of fall   . Aftercare for healing traumatic fracture of hip   . Long term (current) use of anticoagulants   . Anemia   . Depression     Past Surgical History  Procedure Laterality Date  . Lumpectormy    . Hip arthroplasty  08/01/2012    Procedure: ARTHROPLASTY BIPOLAR HIP;  Surgeon: Nita Sells, MD;  Location: [place];  Service: Orthopedics;  Laterality: Left;  Surgeon requests to follow 1st case.  . Abdominal hysterectomy    . Fracture surgery Left 07/2012    hip    History   Social History  . Marital Status: Widowed    Spouse Name: N/A    Number of Children: N/A  . Years of Education: N/A   Occupational History  . Not on file.   Social History Main Topics  . Smoking status: Never Smoker   . Smokeless tobacco: Never Used  . Alcohol Use: No  . Drug Use: No  . Sexual Activity: No   Other Topics  Concern  . Not on file   Social History Narrative  . No narrative on file      Medication List       This list is accurate as of: 07/04/14 10:54 AM.  Always use your most recent med list.               acetaminophen 325 MG tablet  Commonly known as:  TYLENOL  Take 650 mg by mouth 2 (two) times daily at 10 AM and 5 PM.     amLODipine 10 MG tablet  Commonly known as:  NORVASC  Take 5 mg by mouth daily. Take 1/2 tablet equals 5 mg, for HTN.     bisacodyl 10 MG suppository  Commonly known as:  DULCOLAX  Place 10 mg rectally 2 (two) times daily as needed for moderate constipation.     calcium-vitamin D 500-200 MG-UNIT per tablet  Commonly known as:  OSCAL WITH D  Take 1 tablet by mouth daily with breakfast.     citalopram 20 MG tablet  Commonly known as:  CELEXA  Take 20 mg by mouth daily. For depression/ anxiety.     docusate sodium 100 MG capsule  Commonly known as:  COLACE  Take  100 mg by mouth 2 (two) times daily as needed for constipation.     ferrous sulfate 325 (65 FE) MG tablet  Take 325 mg by mouth daily with breakfast.     lisinopril 5 MG tablet  Commonly known as:  PRINIVIL,ZESTRIL  Take 10 mg by mouth daily. For HTN     LORazepam 0.5 MG tablet  Commonly known as:  ATIVAN  Take 1/2 tablet by mouth every night at bedtime as needed for severe agitation only     Melatonin 3 MG Caps  Take 3 mg by mouth daily after supper.     NUEDEXTA 20-10 MG Caps  Generic drug:  Dextromethorphan-Quinidine  Take 1 capsule by mouth 2 (two) times daily.     sennosides-docusate sodium 8.6-50 MG tablet  Commonly known as:  SENOKOT-S  Take 1 tablet by mouth daily.     Vitamin D3 2000 UNITS Tabs  Take 1 capsule by mouth daily.        Physical Exam Filed Vitals:   07/04/14 1047  BP: 123/74  Pulse: 64  Temp: 97.5 F (36.4 C)  Resp: 20   Constitutional: thin elderly female in no acute distress.  HEENT: Normocephalic and atraumatic. PERRL. Conjunctiva clear.   Oral mucosa moist. Posterior pharynx clear of any exudate or lesions.  Neck:  No JVD or carotid bruits. Cardiac: Normal S1, S2. RRR without appreciable murmurs, rubs, or gallops. Distal pulses intact. No dependent edema.  Lungs: No respiratory distress. Breath sounds clear bilaterally without rales, rhonchi, or wheezes. Abdomen: Audible bowel sounds in all quadrants. Soft, nontender, nondistended. No palpable mass.  Musculoskeletal: Able to move all four extremities. No joint erythema or tenderness. Skin: Warm and dry. Chronic scaly lesions on face. No rash noted.  Neurological: Alert  Psychiatric: Has dementia and confusion.   Labs Reviewed CBC Latest Ref Rng 04/18/2014 08/04/2012 08/03/2012  WBC - 2.0 4.6 6.8  Hemoglobin 12.0 - 16.0 g/dL 10.4(A) 8.1(L) 8.7(L)  Hematocrit 36 - 46 % 33(A) 23.1(L) 24.7(L)  Platelets 150 - 399 K/L 153 169 156    CMP     Component Value Date/Time   NA 138 04/18/2014   NA 137 08/04/2012 0615   K 4.3 04/18/2014   CL 103 08/04/2012 0615   CO2 21 08/04/2012 0615   GLUCOSE 75 08/04/2012 0615   BUN 23* 04/18/2014   BUN 22 08/04/2012 0615   CREATININE 0.79 08/04/2012 0615   CALCIUM 8.2* 08/04/2012 0615   PROT 6.2 05/25/2008 1022   ALBUMIN 3.7 05/25/2008 1022   AST 13 04/18/2014   ALT 6* 04/18/2014   ALKPHOS 48 04/18/2014   BILITOT 0.7 05/25/2008 1022   GFRNONAA 75* 08/04/2012 0615   GFRAA 87* 08/04/2012 0615     Assessment & Plan 1. Depression due to dementia Stable. Continue celexa 20mg  daily and monitor for change in behaviors.   2. Essential hypertension, benign Stable. Continue amlodipine 5mg  daily and lisinopril 10mg  daily. Continue to monitor  3. Vascular dementia with behavioral disturbance Persists with decline anticipated. Continue exelon 9.37mcg/24hr patch, nuedexta 20/10mg  daily, and ativan 0.25mg  Q6H as needed.   Family/Staff Communication Plan of care discuss with  professional staff members. Professional staff members verbalize  understanding and agree with plan of care. No additional questions or concerns reported.    Arthur Holms, MSN, AGNP-C [address] [place], [place] [address] [phone] [8am-5pm] After hours: [phone]   I have personally reviewed this note and agree with the care plan  Blanchie Serve, MD  [facility] Adult Medicine [phone] (Monday-Friday 8 am - 5 pm) [phone] (afterhours)

## 2014-08-04 ENCOUNTER — Non-Acute Institutional Stay (SKILLED_NURSING_FACILITY): Payer: PRIVATE HEALTH INSURANCE | Admitting: Internal Medicine

## 2014-08-04 DIAGNOSIS — F0280 Dementia in other diseases classified elsewhere without behavioral disturbance: Secondary | ICD-10-CM

## 2014-08-04 DIAGNOSIS — I10 Essential (primary) hypertension: Secondary | ICD-10-CM

## 2014-08-04 DIAGNOSIS — F0151 Vascular dementia with behavioral disturbance: Secondary | ICD-10-CM

## 2014-08-04 DIAGNOSIS — F0393 Unspecified dementia, unspecified severity, with mood disturbance: Secondary | ICD-10-CM

## 2014-08-04 DIAGNOSIS — F329 Major depressive disorder, single episode, unspecified: Secondary | ICD-10-CM

## 2014-08-04 NOTE — Progress Notes (Signed)
Patient ID: Morgan [name], female   DOB: Dec 15, 1926, 80 y.o.   MRN: [id number]     [facility]  Code status- DNR  Chief complaint- medical management of chronic illness  Allergies- codeine  HPI 80 y/o female patient is here for long term care. She is at her baseline with dementia and has no concerns. No new concern from staff. She is followed by [facility] for depression, delusional disorder. She has been off melatonin. She has been on lisinopril 10 mg daily from 07/12/14. bp stable today.  ROS Unable to obtain from patient No falls reported Getting skin care for skin tear in right elbow Ongoing confusion and delusions but calm this visit  Past Medical History  Diagnosis Date  . Hypertension   . Dementia   . Chronic kidney disease   . Cancer     HX OF BREAST CANCER  . Arthritis   . Macular degeneration   . Anxiety state, unspecified   . Unspecified constipation   . Osteoporosis, unspecified   . Abnormality of gait   . Fracture of neck of femur   . Peripheral vascular complications   . Personal history of fall   . Aftercare for healing traumatic fracture of hip   . Long term (current) use of anticoagulants   . Anemia   . Depression    Medication reviewed. See [facility]  Physical exam BP 116/76 mmHg  Pulse 68  Temp(Src) 98 F (36.7 C)  Resp 18  Wt 116 lb (52.617 kg)  SpO2 95%  Constitutional: elderly female in no acute distress. Can express her needs but unable to carry out meaningful conversation HENT:   Head: Normocephalic.   Mouth/Throat: Oropharynx is clear and moist.   Eyes: Conjunctivae are normal. Pupils are equal, round, and reactive to light.   Neck: Normal range of motion. Neck supple.   Cardiovascular: Normal s1,s2, no murmur, diminished distal pulses, no leg edema  Pulmonary/Chest: Effort normal and breath sounds normal.   Abdominal: Soft. Bowel sounds are normal. She exhibits no distension and no mass.  Musculoskeletal: Normal range of motion.  Able to move all 4 extremities, propelled in wheelchair by others, fall risk  Skin: Skin is warm and dry. She is not diaphoretic.   Psychiatric: Poor insight, poor judgement, dementia   Labs- 03/17/14 wbc 2.8, hb 10.6, hct 33.5, plt 163, inr 1 04/18/14 wbc 2, hb 10.4, hct 33.3, plt 153, na 138, k 4.3, bun 23, cr 0.9, glu 90, ca 8.4 06/28/14 wbc 3.4, hb 11, hct 33.8, plt 151 07/06/14 na 142, k 4.3, bun 23, cr 0.7, glu 100, ca 9.3, ast 13, alp 49, t.bil 0.7   Assessment/plan  Depression continue celexa 20 mg daily and prn ativan at bedtime. Followed by psych services  Vascular dementia Stable, continue exelon patch and her nudexta  HTN Tolerating increased dosing of lisinopril 10 mg daily well. Continue this   Blanchie Serve, MD  [facility] Adult Medicine [phone] (Monday-Friday 8 am - 5 pm) [phone] (afterhours)

## 2014-08-26 ENCOUNTER — Other Ambulatory Visit: Payer: Self-pay | Admitting: *Deleted

## 2014-08-26 MED ORDER — LORAZEPAM 0.5 MG PO TABS: ORAL_TABLET | ORAL | Status: DC

## 2014-08-26 NOTE — Telephone Encounter (Signed)
[facility] 

## 2014-09-08 ENCOUNTER — Non-Acute Institutional Stay (SKILLED_NURSING_FACILITY): Payer: PRIVATE HEALTH INSURANCE | Admitting: Internal Medicine

## 2014-09-08 DIAGNOSIS — E46 Unspecified protein-calorie malnutrition: Secondary | ICD-10-CM

## 2014-09-08 DIAGNOSIS — M150 Primary generalized (osteo)arthritis: Secondary | ICD-10-CM

## 2014-09-08 DIAGNOSIS — M810 Age-related osteoporosis without current pathological fracture: Secondary | ICD-10-CM

## 2014-09-14 DIAGNOSIS — I10 Essential (primary) hypertension: Secondary | ICD-10-CM | POA: Insufficient documentation

## 2014-09-14 DIAGNOSIS — E46 Unspecified protein-calorie malnutrition: Secondary | ICD-10-CM | POA: Insufficient documentation

## 2014-09-14 DIAGNOSIS — M810 Age-related osteoporosis without current pathological fracture: Secondary | ICD-10-CM | POA: Insufficient documentation

## 2014-09-14 DIAGNOSIS — M150 Primary generalized (osteo)arthritis: Secondary | ICD-10-CM | POA: Insufficient documentation

## 2014-09-14 NOTE — Progress Notes (Signed)
Patient ID: Morgan [name], female   DOB: 12-08-1926, 81 y.o.   MRN: [id number]     [facility]  Code status- DNR  Chief complaint- medical management of chronic illness  Allergies- codeine  HPI 81 y/o female patient is here for long term care. She is at her baseline with dementia and has no concerns. She is on ativan 0.25 mg bid for her anxiety and has been somewhat calmer as per staff. She is followed by [facility] for depression, delusional disorder. She has skin tear in her left arm. Weight is low but stable.  ROS Unable to obtain from patient No falls reported Ongoing confusion and delusions  Past Medical History  Diagnosis Date  . Hypertension   . Dementia   . Chronic kidney disease   . Cancer     HX OF BREAST CANCER  . Arthritis   . Macular degeneration   . Anxiety state, unspecified   . Unspecified constipation   . Osteoporosis, unspecified   . Abnormality of gait   . Fracture of neck of femur   . Peripheral vascular complications   . Personal history of fall   . Aftercare for healing traumatic fracture of hip   . Long term (current) use of anticoagulants   . Anemia   . Depression    Medication reviewed. See [facility]  Physical exam BP 123/80 mmHg  Pulse 72  Temp(Src) 97.8 F (36.6 C)  Resp 16  SpO2 97%  Constitutional: elderly female in no acute distress. Can express her needs but unable to carry out meaningful conversation HENT:   Head: Normocephalic.   Mouth/Throat: Oropharynx is clear and moist.   Eyes: Conjunctivae are normal. Pupils are equal, round, and reactive to light.   Neck: Normal range of motion. Neck supple.   Cardiovascular: Normal s1,s2, no murmur, diminished distal pulses, no leg edema  Pulmonary/Chest: Effort normal and breath sounds normal.   Abdominal: Soft. Bowel sounds are normal. She exhibits no distension and no mass.  Musculoskeletal: Normal range of motion. Able to move all 4 extremities, propelled in wheelchair by  others, fall risk  Skin: Skin is warm and dry. She is not diaphoretic. Skin tear in left arm with dressing in place Psychiatric: Poor insight, poor judgement, dementia   Labs- 03/17/14 wbc 2.8, hb 10.6, hct 33.5, plt 163, inr 1 04/18/14 wbc 2, hb 10.4, hct 33.3, plt 153, na 138, k 4.3, bun 23, cr 0.9, glu 90, ca 8.4 06/28/14 wbc 3.4, hb 11, hct 33.8, plt 151 07/06/14 na 142, k 4.3, bun 23, cr 0.7, glu 100, ca 9.3, ast 13, alp 49, t.bil 0.7  Assessment/plan  Senile osteoporosis Continue calcium and vitamin d supplement, fall precautions  Protein calorie malnutrition Persists, decline anticipated with dementia, continue skin care and pressure ulcer prophylaxis, continue feeding supplements, monitor  OA Tylenol 650 mg bid has been helpful, continue this and monitor

## 2014-10-07 ENCOUNTER — Non-Acute Institutional Stay (SKILLED_NURSING_FACILITY): Payer: Medicare Other | Admitting: Internal Medicine

## 2014-10-07 DIAGNOSIS — F0393 Unspecified dementia, unspecified severity, with mood disturbance: Secondary | ICD-10-CM

## 2014-10-07 DIAGNOSIS — I10 Essential (primary) hypertension: Secondary | ICD-10-CM

## 2014-10-07 DIAGNOSIS — F0280 Dementia in other diseases classified elsewhere without behavioral disturbance: Secondary | ICD-10-CM

## 2014-10-07 DIAGNOSIS — N390 Urinary tract infection, site not specified: Secondary | ICD-10-CM

## 2014-10-07 DIAGNOSIS — F329 Major depressive disorder, single episode, unspecified: Secondary | ICD-10-CM

## 2014-10-07 NOTE — Progress Notes (Signed)
Patient ID: Morgan [name], female   DOB: 05-Jun-1927, 81 y.o.   MRN: [id number]    Facility[facility] [place] [facility]  Chief complaint- medical management of chronic illness  Allergies- codeine  HPI 81 y/o female patient is here for long term care. She is at her baseline with dementia and has no concerns. She is on ativan 0.25 mg bid for her anxiety. No falls reported. Weight is low but stable.  ROS Unable to obtain from patient  Past medical history and medication reviewed  Physical exam BP 130/68 mmHg  Pulse 70  Temp(Src) 97 F (36.1 C)  Resp 18  SpO2 95%  Constitutional: elderly female in no acute distress. Can express her needs but unable to carry out meaningful conversation HENT:   Head: Normocephalic.   Mouth/Throat: Oropharynx is clear and moist.   Eyes: Conjunctivae are normal. Pupils are equal, round, and reactive to light.   Neck: Normal range of motion. Neck supple.   Cardiovascular: Normal s1,s2, no murmur, diminished distal pulses, no leg edema   Pulmonary/Chest: Effort normal and breath sounds normal.   Abdominal: Soft. Bowel sounds are normal. She exhibits no distension and no mass.  Musculoskeletal: Normal range of motion. Able to move all 4 extremities, propelled in wheelchair by others, fall risk  Skin: Skin is warm and dry. She is not diaphoretic. Skin tear in left arm with dressing in place Psychiatric: Poor insight, poor judgement, dementia   Labs- 03/17/14 wbc 2.8, hb 10.6, hct 33.5, plt 163, inr 1 04/18/14 wbc 2, hb 10.4, hct 33.3, plt 153, na 138, k 4.3, bun 23, cr 0.9, glu 90, ca 8.4 06/28/14 wbc 3.4, hb 11, hct 33.8, plt 151 07/06/14 na 142, k 4.3, bun 23, cr 0.7, glu 100, ca 9.3, ast 13, alp 49, t.bil 0.7  Assessment/plan  Depression with dementia Continue her celexa 20 mg daily with her exelon patch, no acute behavior changes, f/u with psych services. Continue ativan bid  HTN bp reading stable, continue norvasc 5 mg  daily, lisinopril 10 mg daily  Recurrent uti currently symptom free, continue cranberry supplements

## 2014-11-08 LAB — TSH: TSH: 0.5 u[IU]/mL (ref <=5.90)

## 2014-11-21 ENCOUNTER — Non-Acute Institutional Stay (SKILLED_NURSING_FACILITY): Payer: Medicare Other | Admitting: Internal Medicine

## 2014-11-21 DIAGNOSIS — M159 Polyosteoarthritis, unspecified: Secondary | ICD-10-CM

## 2014-11-21 DIAGNOSIS — M150 Primary generalized (osteo)arthritis: Secondary | ICD-10-CM | POA: Diagnosis not present

## 2014-11-21 DIAGNOSIS — D61818 Other pancytopenia: Secondary | ICD-10-CM

## 2014-11-21 DIAGNOSIS — M810 Age-related osteoporosis without current pathological fracture: Secondary | ICD-10-CM | POA: Diagnosis not present

## 2015-01-02 ENCOUNTER — Non-Acute Institutional Stay (SKILLED_NURSING_FACILITY): Payer: Medicare Other | Admitting: Internal Medicine

## 2015-01-02 DIAGNOSIS — M810 Age-related osteoporosis without current pathological fracture: Secondary | ICD-10-CM | POA: Insufficient documentation

## 2015-01-02 DIAGNOSIS — F411 Generalized anxiety disorder: Secondary | ICD-10-CM

## 2015-01-02 DIAGNOSIS — F0391 Unspecified dementia with behavioral disturbance: Secondary | ICD-10-CM

## 2015-01-02 DIAGNOSIS — D61818 Other pancytopenia: Secondary | ICD-10-CM | POA: Insufficient documentation

## 2015-01-02 DIAGNOSIS — M159 Polyosteoarthritis, unspecified: Secondary | ICD-10-CM | POA: Insufficient documentation

## 2015-01-02 DIAGNOSIS — F482 Pseudobulbar affect: Secondary | ICD-10-CM | POA: Diagnosis not present

## 2015-01-02 DIAGNOSIS — F03918 Unspecified dementia, unspecified severity, with other behavioral disturbance: Secondary | ICD-10-CM | POA: Insufficient documentation

## 2015-01-02 DIAGNOSIS — K5901 Slow transit constipation: Secondary | ICD-10-CM | POA: Diagnosis not present

## 2015-01-02 DIAGNOSIS — M150 Primary generalized (osteo)arthritis: Secondary | ICD-10-CM

## 2015-01-02 DIAGNOSIS — E46 Unspecified protein-calorie malnutrition: Secondary | ICD-10-CM

## 2015-01-02 NOTE — Progress Notes (Signed)
Patient ID: MARIELL NESTER, female   DOB: Nov 08, 1926, 81 y.o.   MRN: [id number]     Facility: [place] [facility]  Chief complaint- medical management of chronic illness  Allergies- codeine  HPI 81 y/o female patient is seen for routine visit. No new concern from staff. Weight stable. No falls. Has behavioral issues but her current regimen of anxiety medication and nudexta helpful. Unable to participate in hpi and ROS  ROS Unable to obtain from patient  Past medical history and medication reviewed  Physical exam BP 121/72 mmHg  Pulse 68  Temp(Src) 97.6 F (36.4 C)  Resp 18  Constitutional: elderly female in no acute distress.  HENT:   Head: Normocephalic.   Mouth/Throat: Oropharynx is clear and moist.   Eyes: Conjunctivae are normal. Pupils are equal, round, and reactive to light.   Neck: Normal range of motion. Neck supple.   Cardiovascular: Normal s1,s2, no murmur, diminished distal pulses, no leg edema   Pulmonary/Chest: Effort normal and breath sounds normal.   Abdominal: Soft. Bowel sounds are normal. She exhibits no distension and no mass.  Musculoskeletal: Normal range of motion. Able to move all 4 extremities, propelled in wheelchair by others, fall risk    Labs- 03/17/14 wbc 2.8, hb 10.6, hct 33.5, plt 163, inr 1 04/18/14 wbc 2, hb 10.4, hct 33.3, plt 153, na 138, k 4.3, bun 23, cr 0.9, glu 90, ca 8.4 06/28/14 wbc 3.4, hb 11, hct 33.8, plt 151 07/06/14 na 142, k 4.3, bun 23, cr 0.7, glu 100, ca 9.3, ast 13, alp 49, t.bil 0.7 11/08/14 wbc 2.2, hb 11.1, hct 34.1, plt 146, na 142, k 4.9, bun 27, cr 1.01, glu 81, caq 8.5, tsh 0.5    Assessment/plan  Age related osteoporosis Continue calcium and vit d, no recent fall or fracture  Generalized OA Tylenol 650 mg bid has been helpful. Monitor clinically  Pancytopenia Stable,  No further workup desired by daughter, monitor clinically   Blanchie Serve, MD  [place] (Monday-Friday 8 am - 5 pm) [phone] (afterhours)

## 2015-01-02 NOTE — Progress Notes (Signed)
Patient ID: Morgan [name], female   DOB: 01-Jan-1927, 81 y.o.   MRN: [id number]    Facility: [place] [facility]  Chief complaint- medical management of chronic illness  Allergies- codeine  Code status- DNR  HPI 81 y/o female patient with dementia at baseline is seen today for routine visit and has no concerns.  No falls reported. Weight is low but stable. Currently on supplements. bp readings have been stable. Mood remains stable. Dementia persists  ROS Unable to obtain from patient  Past medical history and medication reviewed  Physical exam BP 121/69 mmHg  Pulse 80  Temp(Src) 97.9 F (36.6 C)  Resp 18  Ht 5\' 5"  (1.651 m)  Wt 116 lb (52.617 kg)  BMI 19.30 kg/m2  SpO2 97%  Wt Readings from Last 3 Encounters:  01/02/15 116 lb (52.617 kg)  08/04/14 116 lb (52.617 kg)  07/04/14 115 lb 6.4 oz (52.345 kg)    Constitutional: elderly female in no acute distress. Can express her needs but unable to carry out meaningful conversation HENT:   Head: Normocephalic.   Mouth/Throat: Oropharynx is clear and moist.   Eyes: Conjunctivae are normal. Pupils are equal, round, and reactive to light.   Neck: Normal range of motion. Neck supple.   Cardiovascular: Normal s1,s2, no murmur, diminished distal pulses, no leg edema   Pulmonary/Chest: Effort normal and breath sounds normal.   Abdominal: Soft. Bowel sounds are normal. She exhibits no distension and no mass.  Musculoskeletal: Normal range of motion. Able to move all 4 extremities, propelled in wheelchair by others, fall risk  Skin: Skin is warm and dry. She is not diaphoretic. Actinic keratosis on face- chronic Psychiatric: Poor insight, poor judgement, dementia   Labs- 03/17/14 wbc 2.8, hb 10.6, hct 33.5, plt 163, inr 1 04/18/14 wbc 2, hb 10.4, hct 33.3, plt 153, na 138, k 4.3, bun 23, cr 0.9, glu 90, ca 8.4 06/28/14 wbc 3.4, hb 11, hct 33.8, plt 151 07/06/14 na 142, k 4.3, bun 23, cr 0.7, glu 100, ca  9.3, ast 13, alp 49, t.bil 0.7 11/08/14 wbc 2.2, hb 11.1, hct 34.1, plt 146, na 142, k 4.9, bun 27, cr 1.01, glu 81, ca 8.5, tsh 0.5   Assessment/plan  Protein calorie malnutrition Continue medpass supplement and vitamin. Weight stable. Monitor weight  Advanced senile dementia Persists and decline anticipated. Continue exelon patch 9.5 mg daily and celexa for depression  GAD Stable on ativan 0.25 mg bid with 0.25 mg q6h prn.   Constipation Stable, continue senokot s 2 tab bid  PBA With her dementia. Continue nudexta 20-10 1 capsule bid and monitor.  Blanchie Serve, MD  [facility] Adult Medicine [phone] (Monday-Friday 8 am - 5 pm) [phone] (afterhours)

## 2015-01-16 ENCOUNTER — Non-Acute Institutional Stay (SKILLED_NURSING_FACILITY): Payer: Medicare Other | Admitting: Internal Medicine

## 2015-01-16 DIAGNOSIS — D638 Anemia in other chronic diseases classified elsewhere: Secondary | ICD-10-CM | POA: Diagnosis not present

## 2015-01-16 DIAGNOSIS — N390 Urinary tract infection, site not specified: Secondary | ICD-10-CM | POA: Diagnosis not present

## 2015-01-16 DIAGNOSIS — I1310 Hypertensive heart and chronic kidney disease without heart failure, with stage 1 through stage 4 chronic kidney disease, or unspecified chronic kidney disease: Secondary | ICD-10-CM | POA: Diagnosis not present

## 2015-01-16 DIAGNOSIS — F330 Major depressive disorder, recurrent, mild: Secondary | ICD-10-CM | POA: Diagnosis not present

## 2015-01-16 DIAGNOSIS — IMO0001 Reserved for inherently not codable concepts without codable children: Secondary | ICD-10-CM

## 2015-01-16 NOTE — Progress Notes (Signed)
Patient ID: Morgan [name], female   DOB: 08/12/1927, 81 y.o.   MRN: [id number]     Facility: [place] [facility]  Chief complaint- medical management of chronic illness  Allergies- codeine  Code status- DNR  HPI 81 y/o female patient is seen for routine visit. She is lying in her bed, pleasantly confused but in no distress. No new concerns from staff. No falls reported. Weight is low but stable. No acute behavioral changes. Followed by psychiatry.   ROS Unable to obtain from patient  Past medical history and medication reviewed  Physical exam BP 126/69 mmHg  Pulse 78  Temp(Src) 97.8 F (36.6 C)  Resp 18  Ht 5\' 5"  (1.651 m)  Wt 115 lb 12.8 oz (52.527 kg)  BMI 19.27 kg/m2  SpO2 95%  Wt Readings from Last 3 Encounters:  01/16/15 115 lb 12.8 oz (52.527 kg)  01/02/15 116 lb (52.617 kg)  08/04/14 116 lb (52.617 kg)    Constitutional: elderly female in no acute distress. Can express her needs but unable to carry out meaningful conversation HENT:   Head: Normocephalic.   Mouth/Throat: Oropharynx is clear and moist.   Eyes: Conjunctivae are normal. Pupils are equal, round, and reactive to light. Wears glasses Neck: Normal range of motion. Neck supple.   Cardiovascular: Normal s1,s2, no murmur, diminished distal pulses, no leg edema   Pulmonary/Chest: Effort normal and breath sounds normal.   Abdominal: Soft. Bowel sounds are normal. She exhibits no distension and no mass.  Musculoskeletal: Able to move all 4 extremities, propelled in wheelchair by others, fall risk  Skin: Skin is warm and dry. She is not diaphoretic. Actinic keratosis on face- chronic Psychiatric: Poor insight, poor judgement, dementia   Labs- 03/17/14 wbc 2.8, hb 10.6, hct 33.5, plt 163, inr 1 04/18/14 wbc 2, hb 10.4, hct 33.3, plt 153, na 138, k 4.3, bun 23, cr 0.9, glu 90, ca 8.4 06/28/14 wbc 3.4, hb 11, hct 33.8, plt 151 07/06/14 na 142, k 4.3, bun 23, cr 0.7, glu 100, ca  9.3, ast 13, alp 49, t.bil 0.7 11/08/14 wbc 2.2, hb 11.1, hct 34.1, plt 146, na 142, k 4.9, bun 27, cr 1.01, glu 81, ca 8.5, tsh 0.5    Assessment/plan  Depression Stable, continue celexa 20 mg daily  HTN bp stable. Continue norvasc 5 mg daily and lisinopril 10 mg daily, monitor bp  UTI prophylaxis Continue cranberry supplement, hydration is important  Anemia of chronic disease Continue iron supplement, monitor clinically  Blanchie Serve, MD  [facility] Adult Medicine [phone] (Monday-Friday 8 am - 5 pm) [phone] (afterhours)

## 2015-02-08 LAB — CBC AND DIFFERENTIAL
HEMATOCRIT: 30 % — AB (ref 36–46)
Hemoglobin: 10.3 g/dL — AB (ref 12.0–16.0)
Platelets: 164 10*3/uL (ref 150–399)
WBC: 2.6 10^3/mL

## 2015-02-08 LAB — HEPATIC FUNCTION PANEL
AST: 11 U/L — AB (ref 13–35)
Alkaline Phosphatase: 48 U/L (ref 25–125)
BILIRUBIN, TOTAL: 0.5 mg/dL

## 2015-02-08 LAB — BASIC METABOLIC PANEL
BUN: 30 mg/dL — AB (ref 4–21)
CREATININE: 0.8 mg/dL (ref 0.5–1.1)
Glucose: 90 mg/dL
POTASSIUM: 4.1 mmol/L (ref 3.4–5.3)
Sodium: 142 mmol/L (ref 137–147)

## 2015-02-23 ENCOUNTER — Non-Acute Institutional Stay (SKILLED_NURSING_FACILITY): Payer: Medicare Other | Admitting: Internal Medicine

## 2015-02-23 ENCOUNTER — Encounter: Payer: Self-pay | Admitting: Internal Medicine

## 2015-02-23 DIAGNOSIS — M810 Age-related osteoporosis without current pathological fracture: Secondary | ICD-10-CM

## 2015-02-23 DIAGNOSIS — D638 Anemia in other chronic diseases classified elsewhere: Secondary | ICD-10-CM

## 2015-02-23 DIAGNOSIS — F482 Pseudobulbar affect: Secondary | ICD-10-CM

## 2015-02-23 NOTE — Progress Notes (Signed)
Patient ID: ARIANN KHAIMOV, female   DOB: [birth year], 81 y.o.   MRN: [id number]    [facility]  Code Status: DNR  Chief Complaint  Patient presents with  . Medical Management of Chronic Issues    Routine Visit    Allergies- codeine  Code status- DNR  HPI 81 y/o female patient is seen for routine visit. She is lying in her bed, pleasantly confused but in no distress. No new concerns from staff. No falls reported. Weight is low but stable. No acute behavioral changes.   ROS Unable to obtain from patient  Past medical history and medication reviewed   Medication List       This list is accurate as of: 02/23/15  5:34 PM.  Always use your most recent med list.               acetaminophen 325 MG tablet  Commonly known as:  TYLENOL  Take 650 mg by mouth 2 (two) times daily at 10 AM and 5 PM.     amLODipine 2.5 MG tablet  Commonly known as:  NORVASC  Take 2.5 mg by mouth daily.     calcium-vitamin D 500-200 MG-UNIT per tablet  Commonly known as:  OSCAL WITH D  Take 1 tablet by mouth daily with breakfast.     citalopram 20 MG tablet  Commonly known as:  CELEXA  Take 20 mg by mouth daily. For depression/ anxiety.     Cranberry 200 MG Caps  Take 400 mg by mouth 2 (two) times daily. For UTI prevention     lisinopril 10 MG tablet  Commonly known as:  PRINIVIL,ZESTRIL  Take 10 mg by mouth daily. Hold for SBP < 120     LORazepam 0.5 MG tablet  Commonly known as:  ATIVAN  Take 1/2 tablet by mouth twice daily for anxiety; Take 1/2 tablet by mouth every 6 hours as needed for anxiety     NUEDEXTA 20-10 MG Caps  Generic drug:  Dextromethorphan-Quinidine  Take 1 capsule by mouth 2 (two) times daily.     rivastigmine 9.5 mg/24hr  Commonly known as:  EXELON  Place 9.5 mg onto the skin daily. Rotate sites, for dementia     sennosides-docusate sodium 8.6-50 MG tablet  Commonly known as:  SENOKOT-S  Take 2 tablets by mouth 2 (two) times daily.     Vitamin D3 2000 UNITS Tabs  Take 1 capsule by mouth daily.        Physical exam Resp 20  Ht 5\' 5"  (1.651 m)  Wt 114 lb (51.71 kg)  BMI 18.97 kg/m2  Wt Readings from Last 3 Encounters:  02/23/15 114 lb (51.71 kg)  01/16/15 115 lb 12.8 oz (52.527 kg)  01/02/15 116 lb (52.617 kg)    Constitutional: elderly female in no acute distress. Can express her needs to some extent but unable to carry out meaningful conversation HENT:   Head: Normocephalic.   Mouth/Throat: Oropharynx is clear and moist.   Eyes: Conjunctivae are normal. Pupils are equal, round, and reactive to light. Wears glasses Neck: Normal range of motion. Neck supple.   Cardiovascular: Normal s1,s2, no murmur, diminished distal pulses, no leg edema   Pulmonary/Chest: Effort normal and breath sounds normal.   Abdominal: Soft. Bowel sounds are normal. She exhibits no distension and no mass.  Musculoskeletal: Able to move all 4 extremities, propelled in wheelchair by others, fall risk  Skin: Skin is warm and dry. She is not diaphoretic.  Actinic keratosis on face- chronic Psychiatric: Poor insight, poor judgement, dementia   Labs- 03/17/14 wbc 2.8, hb 10.6, hct 33.5, plt 163, inr 1 04/18/14 wbc 2, hb 10.4, hct 33.3, plt 153, na 138, k 4.3, bun 23, cr 0.9, glu 90, ca 8.4 06/28/14 wbc 3.4, hb 11, hct 33.8, plt 151 07/06/14 na 142, k 4.3, bun 23, cr 0.7, glu 100, ca 9.3, ast 13, alp 49, t.bil 0.7 11/08/14 wbc 2.2, hb 11.1, hct 34.1, plt 146, na 142, k 4.9, bun 27, cr 1.01, glu 81, ca 8.5, tsh 0.5  02/08/15 wbc 2.6, hb 10.3, hct 29.8, plt 164, mcv 90.6, na 142, k 4.1, bun 30, cr 0.75, alb 2.6, ca 8.1   Assessment/plan  Anemia of chronic disease Slight drop in hb from before. Normal mcv. Continue ferrous sulfate 325 mg but increase to bid  Senile osteoporosis Continue oscal with d, vitamin d supplement, fall precautions  PBA Stable on nudexta, continue current regimen   Blanchie Serve, MD  [facility]  Medicine [phone] (Monday-Friday 8 am - 5 pm) [phone] (afterhours)

## 2015-03-01 ENCOUNTER — Other Ambulatory Visit: Payer: Self-pay | Admitting: *Deleted

## 2015-03-01 MED ORDER — LORAZEPAM 0.5 MG PO TABS: ORAL_TABLET | ORAL | Status: DC

## 2015-03-01 NOTE — Telephone Encounter (Signed)
[facility] 

## 2015-04-04 ENCOUNTER — Encounter: Payer: Self-pay | Admitting: Internal Medicine

## 2015-04-04 ENCOUNTER — Non-Acute Institutional Stay (SKILLED_NURSING_FACILITY): Payer: Medicare Other | Admitting: Internal Medicine

## 2015-04-04 DIAGNOSIS — N183 Chronic kidney disease, stage 3 (moderate): Secondary | ICD-10-CM

## 2015-04-04 DIAGNOSIS — I1310 Hypertensive heart and chronic kidney disease without heart failure, with stage 1 through stage 4 chronic kidney disease, or unspecified chronic kidney disease: Secondary | ICD-10-CM | POA: Diagnosis not present

## 2015-04-04 DIAGNOSIS — I739 Peripheral vascular disease, unspecified: Secondary | ICD-10-CM | POA: Diagnosis not present

## 2015-04-04 NOTE — Progress Notes (Signed)
Patient ID: Morgan [name], female   DOB: 05-Jan-1927, 81 y.o.   MRN: [id number]      [facility]  Code Status: DNR  Chief Complaint  Patient presents with  . Medical Management of Chronic Issues    Routine Visit    Allergies- codeine  Code status- DNR  HPI 81 y/o female patient is seen for routine visit. She is lying in her bed. Continues to have behavioral changes with hollering in the hallway and some agitation mainly in evening time. Currently calm and pleasantly confused but in no distress. Skin tear to right arm. No leg wounds. bp reading stable on review, refuses vitals at times. refused vital check today. No new concerns from staff. No falls reported. Weight is low but stable. No acute behavioral changes.   ROS Unable to obtain from patient  Past medical history and medication reviewed   Medication List       This list is accurate as of: 04/04/15  4:12 PM.  Always use your most recent med list.               acetaminophen 325 MG tablet  Commonly known as:  TYLENOL  Take 650 mg by mouth 2 (two) times daily at 10 AM and 5 PM.     amLODipine 2.5 MG tablet  Commonly known as:  NORVASC  Take 2.5 mg by mouth daily.     calcium-vitamin D 500-200 MG-UNIT per tablet  Commonly known as:  OSCAL WITH D  Take 1 tablet by mouth daily with breakfast.     citalopram 20 MG tablet  Commonly known as:  CELEXA  Take 20 mg by mouth daily. For depression/ anxiety.     Cranberry 200 MG Caps  Take 400 mg by mouth 2 (two) times daily. For UTI prevention     ferrous sulfate 325 (65 FE) MG tablet  Take 325 mg by mouth 2 (two) times daily with a meal.     lisinopril 10 MG tablet  Commonly known as:  PRINIVIL,ZESTRIL  Take 10 mg by mouth daily. Hold for SBP < 120     LORazepam 0.5 MG tablet  Commonly known as:  ATIVAN  Take 1/2 tablet by mouth twice daily for anxiety; Take 1/2 tablet by mouth every 6 hours as needed for anxiety     NUEDEXTA 20-10 MG Caps    Generic drug:  Dextromethorphan-Quinidine  Take 1 capsule by mouth 2 (two) times daily.     rivastigmine 9.5 mg/24hr  Commonly known as:  EXELON  Place 9.5 mg onto the skin daily. Rotate sites, for dementia     sennosides-docusate sodium 8.6-50 MG tablet  Commonly known as:  SENOKOT-S  Take 2 tablets by mouth 2 (two) times daily.     Vitamin D3 2000 UNITS Tabs  Take 1 capsule by mouth daily.        Physical exam Ht 5\' 5"  (1.651 m)  Wt 120 lb 9.6 oz (54.704 kg)  BMI 20.07 kg/m2  Wt Readings from Last 3 Encounters:  04/04/15 120 lb 9.6 oz (54.704 kg)  02/23/15 114 lb (51.71 kg)  01/16/15 115 lb 12.8 oz (52.527 kg)    Constitutional: elderly female in no acute distress. Can express her needs to some extent but unable to carry out meaningful conversation HENT:   Head: Normocephalic.   Mouth/Throat: Oropharynx is clear and moist.   Eyes: Conjunctivae are normal. Pupils are equal, round, and reactive to light. Wears glasses  Neck: Normal range of motion. Neck supple.   Cardiovascular: Normal s1,s2, no murmur, diminished distal pulses, no leg edema   Pulmonary/Chest: Effort normal and breath sounds normal.   Abdominal: Soft. Bowel sounds are normal. She exhibits no distension and no mass.  Musculoskeletal: Able to move all 4 extremities, propelled in wheelchair by others, fall risk  Skin: Skin is warm and dry. She is not diaphoretic. Actinic keratosis on face- chronic. Right arm skin tear Psychiatric: Poor insight, poor judgement, dementia   Labs- 03/17/14 wbc 2.8, hb 10.6, hct 33.5, plt 163, inr 1 04/18/14 wbc 2, hb 10.4, hct 33.3, plt 153, na 138, k 4.3, bun 23, cr 0.9, glu 90, ca 8.4 06/28/14 wbc 3.4, hb 11, hct 33.8, plt 151 07/06/14 na 142, k 4.3, bun 23, cr 0.7, glu 100, ca 9.3, ast 13, alp 49, t.bil 0.7 11/08/14 wbc 2.2, hb 11.1, hct 34.1, plt 146, na 142, k 4.9, bun 27, cr 1.01, glu 81, ca 8.5, tsh 0.5  02/08/15 wbc 2.6, hb 10.3, hct 29.8, plt 164, mcv 90.6, na 142, k 4.1,  bun 30, cr 0.75, alb 2.6, ca 8.1   Assessment/plan  ckd stage 3 Stable, monitor renal function, continue lisinopril 10 mg daily for now  Hypertensive heart/ renal disease Without heart failure. Continue amlodipine 2.5 mg daily with lisinopril 10 mg daily and monitor bp reading  PVD Chronic, stable, bp reading stable, no leg wounds. Not on aspirin as declined by RP. Monitor clinically. Continue skin care  Blanchie Serve, MD  [facility] Adult [facility] [phone] (Monday-Friday 8 am - 5 pm) [phone] (afterhours)

## 2015-04-08 DIAGNOSIS — N189 Chronic kidney disease, unspecified: Secondary | ICD-10-CM | POA: Insufficient documentation

## 2015-04-08 DIAGNOSIS — I1310 Hypertensive heart and chronic kidney disease without heart failure, with stage 1 through stage 4 chronic kidney disease, or unspecified chronic kidney disease: Secondary | ICD-10-CM | POA: Insufficient documentation

## 2015-04-08 DIAGNOSIS — I739 Peripheral vascular disease, unspecified: Secondary | ICD-10-CM | POA: Insufficient documentation

## 2015-05-09 ENCOUNTER — Non-Acute Institutional Stay (SKILLED_NURSING_FACILITY): Payer: Medicare Other | Admitting: Internal Medicine

## 2015-05-09 DIAGNOSIS — D509 Iron deficiency anemia, unspecified: Secondary | ICD-10-CM | POA: Diagnosis not present

## 2015-05-09 DIAGNOSIS — L570 Actinic keratosis: Secondary | ICD-10-CM

## 2015-05-09 DIAGNOSIS — N183 Chronic kidney disease, stage 3 (moderate): Secondary | ICD-10-CM | POA: Diagnosis not present

## 2015-05-09 NOTE — Progress Notes (Signed)
Patient ID: Morgan [name], female   DOB: June 05, 1927, 81 y.o.   MRN: [id number]       [facility]  Code Status: DNR  Chief Complaint  Patient presents with  . Medical Management of Chronic Issues   Allergies- codeine  HPI 81 y/o female patient is seen for routine visit. bp remains elevated. No falls reported. Weight is low but stable. No acute behavioral changes. Skin tear to left arm has resolved. Currently on iron supplement once a day from twice a day.  ROS Unable to obtain from patient  Past medical history and medication reviewed   Medication List       This list is accurate as of: 05/09/15 11:59 PM.  Always use your most recent med list.               acetaminophen 325 MG tablet  Commonly known as:  TYLENOL  Take 650 mg by mouth 2 (two) times daily at 10 AM and 5 PM.     amLODipine 2.5 MG tablet  Commonly known as:  NORVASC  Take 2.5 mg by mouth daily.     calcium-vitamin D 500-200 MG-UNIT per tablet  Commonly known as:  OSCAL WITH D  Take 1 tablet by mouth daily with breakfast.     citalopram 20 MG tablet  Commonly known as:  CELEXA  Take 20 mg by mouth daily. For depression/ anxiety.     Cranberry 200 MG Caps  Take 400 mg by mouth 2 (two) times daily. For UTI prevention     ferrous sulfate 325 (65 FE) MG tablet  Take 325 mg by mouth 2 (two) times daily with a meal.     lisinopril 10 MG tablet  Commonly known as:  PRINIVIL,ZESTRIL  Take 10 mg by mouth daily. Hold for SBP < 120     LORazepam 0.5 MG tablet  Commonly known as:  ATIVAN  Take 1/2 tablet by mouth twice daily for anxiety; Take 1/2 tablet by mouth every 6 hours as needed for anxiety     NUEDEXTA 20-10 MG Caps  Generic drug:  Dextromethorphan-Quinidine  Take 1 capsule by mouth 2 (two) times daily.     rivastigmine 9.5 mg/24hr  Commonly known as:  EXELON  Place 9.5 mg onto the skin daily. Rotate sites, for dementia     sennosides-docusate sodium 8.6-50 MG tablet    Commonly known as:  SENOKOT-S  Take 2 tablets by mouth 2 (two) times daily.     Vitamin D3 2000 UNITS Tabs  Take 1 capsule by mouth daily.        Physical exam BP 150/62 mmHg  Pulse 68  Temp(Src) 98 F (36.7 C)  Resp 18  Wt Readings from Last 3 Encounters:  04/04/15 120 lb 9.6 oz (54.704 kg)  02/23/15 114 lb (51.71 kg)  01/16/15 115 lb 12.8 oz (52.527 kg)    Constitutional: elderly female in no acute distress. Can express her needs to some extent but unable to carry out meaningful conversation HENT:   Head: Normocephalic.   Mouth/Throat: Oropharynx is clear and moist.   Eyes: Conjunctivae are normal. Pupils are equal, round, and reactive to light. Wears glasses Neck: Normal range of motion. Neck supple.   Cardiovascular: Normal s1,s2, no murmur, diminished distal pulses, no leg edema   Pulmonary/Chest: Effort normal and breath sounds normal.   Abdominal: Soft. Bowel sounds are normal. She exhibits no distension and no mass.  Musculoskeletal: Able to move all  4 extremities, propelled in wheelchair by others, fall risk  Skin: Skin is warm and dry. keratosis on face- chronic.  Psychiatric: Poor insight, poor judgement, dementia   Labs- 03/17/14 wbc 2.8, hb 10.6, hct 33.5, plt 163, inr 1 04/18/14 wbc 2, hb 10.4, hct 33.3, plt 153, na 138, k 4.3, bun 23, cr 0.9, glu 90, ca 8.4 06/28/14 wbc 3.4, hb 11, hct 33.8, plt 151 07/06/14 na 142, k 4.3, bun 23, cr 0.7, glu 100, ca 9.3, ast 13, alp 49, t.bil 0.7 11/08/14 wbc 2.2, hb 11.1, hct 34.1, plt 146, na 142, k 4.9, bun 27, cr 1.01, glu 81, ca 8.5, tsh 0.5  02/08/15 wbc 2.6, hb 10.3, hct 29.8, plt 164, mcv 90.6, na 142, k 4.1, bun 30, cr 0.75, alb 2.6, ca 8.1   Assessment/plan  Actinic keratosis Chronic, stable, continue to try and maintain moisture to her face. No signs of infection. Monitor  ckd stage 3 Stable, monitor renal function, continue lisinopril 10 mg daily for now  Iron def anemia Continue ferrous sulfate 325 mg  daily   Blanchie Serve, MD  [facility] Medicine [phone] (Monday-Friday 8 am - 5 pm) [phone] (afterhours)

## 2015-05-10 DIAGNOSIS — L570 Actinic keratosis: Secondary | ICD-10-CM | POA: Insufficient documentation

## 2015-06-01 ENCOUNTER — Encounter: Payer: Self-pay | Admitting: Internal Medicine

## 2015-06-01 ENCOUNTER — Non-Acute Institutional Stay (SKILLED_NURSING_FACILITY): Payer: Medicare Other | Admitting: Internal Medicine

## 2015-06-01 DIAGNOSIS — M810 Age-related osteoporosis without current pathological fracture: Secondary | ICD-10-CM | POA: Diagnosis not present

## 2015-06-01 DIAGNOSIS — F01518 Vascular dementia, unspecified severity, with other behavioral disturbance: Secondary | ICD-10-CM

## 2015-06-01 DIAGNOSIS — F0151 Vascular dementia with behavioral disturbance: Secondary | ICD-10-CM

## 2015-06-01 DIAGNOSIS — I10 Essential (primary) hypertension: Secondary | ICD-10-CM | POA: Diagnosis not present

## 2015-06-01 NOTE — Progress Notes (Signed)
Patient ID: FELECITY LEMASTER, female   DOB: [birth year], 81 y.o.   MRN: [id number]        [facility]  Code Status: DNR  Chief Complaint  Patient presents with  . Medical Management of Chronic Issues    Routine Visit    Allergies  Allergen Reactions  . Codeine Nausea And Vomiting    HPI 81 y/o female patient is seen for routine visit. Has been at her baseline with intermittent confusion. She occassionally refuses medication. bp readings are elevated. No falls reported. New left arm skin tear.  ROS Unable to obtain from patient  Past medical history and medication reviewed   Medication List       This list is accurate as of: 06/01/15  4:36 PM.  Always use your most recent med list.               acetaminophen 325 MG tablet  Commonly known as:  TYLENOL  Take 650 mg by mouth 2 (two) times daily at 10 AM and 5 PM.     amLODipine 2.5 MG tablet  Commonly known as:  NORVASC  Take 2.5 mg by mouth daily.     calcium-vitamin D 500-200 MG-UNIT per tablet  Commonly known as:  OSCAL WITH D  Take 1 tablet by mouth daily with breakfast.     citalopram 20 MG tablet  Commonly known as:  CELEXA  Take 20 mg by mouth daily. For depression/ anxiety.     Cranberry 200 MG Caps  Take 400 mg by mouth 2 (two) times daily. For UTI prevention     ferrous sulfate 325 (65 FE) MG tablet  Take 325 mg by mouth daily with breakfast.     lisinopril 10 MG tablet  Commonly known as:  PRINIVIL,ZESTRIL  Take 10 mg by mouth daily. Hold for SBP < 120     LORazepam 0.5 MG tablet  Commonly known as:  ATIVAN  Take 1/2 tablet by mouth twice daily for anxiety; Take 1/2 tablet by mouth every 6 hours as needed for anxiety     NUEDEXTA 20-10 MG Caps  Generic drug:  Dextromethorphan-Quinidine  Take 1 capsule by mouth 2 (two) times daily.     rivastigmine 9.5 mg/24hr  Commonly known as:  EXELON  Place 9.5 mg onto the skin daily. Rotate sites, for dementia     sennosides-docusate sodium 8.6-50 MG tablet  Commonly known as:  SENOKOT-S  Take 2 tablets by mouth 2 (two) times daily.        Physical exam BP 154/88 mmHg  Pulse 66  Resp 18  Wt Readings from Last 3 Encounters:  04/04/15 120 lb 9.6 oz (54.704 kg)  02/23/15 114 lb (51.71 kg)  01/16/15 115 lb 12.8 oz (52.527 kg)    Constitutional: elderly female in no acute distress.  Head: Normocephalic.   Mouth/Throat: Oropharynx is clear and moist.   Eyes: Conjunctivae are normal. Pupils are equal, round, and reactive to light. Wears glasses Neck: Normal range of motion. Neck supple.   Cardiovascular: Normal s1,s2, no murmur, diminished distal pulses, no leg edema   Pulmonary/Chest: Effort normal and breath sounds normal.   Abdominal: Soft. Bowel sounds are normal. She exhibits no distension and no mass.  Musculoskeletal: Able to move all 4 extremities, propelled in wheelchair by others, fall risk + Skin: Skin is warm and dry. keratosis on face- chronic.  Psychiatric: Poor insight, poor judgement, dementia   Labs- 03/17/14 wbc 2.8, hb 10.6,  hct 33.5, plt 163, inr 1 04/18/14 wbc 2, hb 10.4, hct 33.3, plt 153, na 138, k 4.3, bun 23, cr 0.9, glu 90, ca 8.4 06/28/14 wbc 3.4, hb 11, hct 33.8, plt 151 07/06/14 na 142, k 4.3, bun 23, cr 0.7, glu 100, ca 9.3, ast 13, alp 49, t.bil 0.7 11/08/14 wbc 2.2, hb 11.1, hct 34.1, plt 146, na 142, k 4.9, bun 27, cr 1.01, glu 81, ca 8.5, tsh 0.5  02/08/15 wbc 2.6, hb 10.3, hct 29.8, plt 164, mcv 90.6, na 142, k 4.1, bun 30, cr 0.75, alb 2.6, ca 8.1   Assessment/plan  Hypertension Currently on norvasc 2.5 mg daily, lisinopril 10 mg daily. bp reading elevated. With her readings being elevated and her intermittently refusing medication, d/c norvasc and start clonidine 0.1 mg weekly patch and reassess. Monitor bp  Dementia with behavior disturbance Advanced, on exelon patch. Continue celexa 20 mg daily and lorazepam 0.25 mg q6h prn anxiety. Continue  nudexta  Senile osteoporosis Continue oscal with vitamin d and fall precautions  Blanchie Serve, MD  [facility] Adult Medicine [phone] (Monday-Friday 8 am - 5 pm) [phone] (afterhours)

## 2015-06-12 LAB — CBC AND DIFFERENTIAL
HCT: 43 % (ref 36–46)
HEMOGLOBIN: 14.3 g/dL (ref 12.0–16.0)
Platelets: 227 10*3/uL (ref 150–399)
WBC: 11.2 10^3/mL

## 2015-10-06 ENCOUNTER — Non-Acute Institutional Stay (SKILLED_NURSING_FACILITY): Payer: Medicare Other | Admitting: Internal Medicine

## 2015-10-06 ENCOUNTER — Encounter: Payer: Self-pay | Admitting: Internal Medicine

## 2015-10-06 DIAGNOSIS — M159 Polyosteoarthritis, unspecified: Secondary | ICD-10-CM

## 2015-10-06 DIAGNOSIS — M810 Age-related osteoporosis without current pathological fracture: Secondary | ICD-10-CM | POA: Diagnosis not present

## 2015-10-06 DIAGNOSIS — F482 Pseudobulbar affect: Secondary | ICD-10-CM

## 2015-10-06 DIAGNOSIS — I10 Essential (primary) hypertension: Secondary | ICD-10-CM

## 2015-10-06 DIAGNOSIS — F0391 Unspecified dementia with behavioral disturbance: Secondary | ICD-10-CM | POA: Diagnosis not present

## 2015-10-06 DIAGNOSIS — D509 Iron deficiency anemia, unspecified: Secondary | ICD-10-CM | POA: Diagnosis not present

## 2015-10-06 DIAGNOSIS — S41111S Laceration without foreign body of right upper arm, sequela: Secondary | ICD-10-CM

## 2015-10-06 DIAGNOSIS — S41101S Unspecified open wound of right upper arm, sequela: Secondary | ICD-10-CM | POA: Diagnosis not present

## 2015-10-06 DIAGNOSIS — F03918 Unspecified dementia, unspecified severity, with other behavioral disturbance: Secondary | ICD-10-CM

## 2015-10-06 NOTE — Progress Notes (Signed)
Patient ID: Morgan [name], female   DOB: May 14, 1927, 82 y.o.   MRN: [id number]        [facility]  Code Status: DNR  Chief Complaint  Patient presents with  . Medical Management of Chronic Issues    Routine Visit   Allergies  Allergen Reactions  . Codeine Nausea And Vomiting    HPI 82 y/o female patient is seen for routine visit. No new concern from staff. Has been at her baseline with intermittent confusion. She requires assistance with meals. She wears splints during the day to her upper extremities to prevent contractures. She occassionally refuses medication. bp readings are elevated. No falls reported. right arm skin tear has resolved.   ROS Unable to obtain from patient  Past medical history and medication reviewed   Medication List       This list is accurate as of: 10/06/15  2:32 PM.  Always use your most recent med list.               acetaminophen 325 MG tablet  Commonly known as:  TYLENOL  Take 650 mg by mouth 2 (two) times daily at 10 AM and 5 PM.     calcium-vitamin D 500-200 MG-UNIT tablet  Commonly known as:  OSCAL WITH D  Take 1 tablet by mouth daily with breakfast.     citalopram 20 MG tablet  Commonly known as:  CELEXA  Take 20 mg by mouth daily. For depression/ anxiety.     cloNIDine 0.2 mg/24hr patch  Commonly known as:  CATAPRES - Dosed in mg/24 hr  Place 0.2 mg onto the skin once a week.     Cranberry 200 MG Caps  Take 400 mg by mouth 2 (two) times daily. For UTI prevention     D3-1000 1000 units tablet  Generic drug:  Cholecalciferol  Take 2,000 Units by mouth daily.     ferrous sulfate 325 (65 FE) MG tablet  Take 325 mg by mouth daily with breakfast.     lisinopril 10 MG tablet  Commonly known as:  PRINIVIL,ZESTRIL  Take 10 mg by mouth daily. Hold for SBP < 120     LORazepam 0.5 MG tablet  Commonly known as:  ATIVAN  Take 1/2 tablet by mouth twice daily for anxiety; Take 1/2 tablet by mouth every 6 hours  as needed for anxiety     NUEDEXTA 20-10 MG Caps  Generic drug:  Dextromethorphan-Quinidine  Take 1 capsule by mouth 2 (two) times daily.     rivastigmine 9.5 mg/24hr  Commonly known as:  EXELON  Place 9.5 mg onto the skin daily. Rotate sites, for dementia     sennosides-docusate sodium 8.6-50 MG tablet  Commonly known as:  SENOKOT-S  Take 2 tablets by mouth 2 (two) times daily.     UNABLE TO FIND  Med Name: Med Pass 60 mL 3 times a day due to low albumin        Physical exam BP 175/76 mmHg  Pulse 59  Temp(Src) 97.5 F (36.4 C) (Oral)  Resp 18  Ht 5\' 5"  (1.651 m)  Wt 115 lb 6.4 oz (52.345 kg)  BMI 19.20 kg/m2  Wt Readings from Last 3 Encounters:  10/06/15 115 lb 6.4 oz (52.345 kg)  04/04/15 120 lb 9.6 oz (54.704 kg)  02/23/15 114 lb (51.71 kg)    Constitutional: elderly frail female in no acute distress.  Head: Normocephalic.   Mouth/Throat: Oropharynx is clear and moist.  Eyes: Conjunctivae are normal. Pupils are equal, round, and reactive to light. Wears glasses Neck: Normal range of motion. Neck supple.   Cardiovascular: Normal s1,s2, no murmur, diminished distal pulses, no leg edema   Pulmonary/Chest: Effort normal and breath sounds normal.   Abdominal: Soft. Bowel sounds are normal. She exhibits no distension and no mass.  Musculoskeletal: Able to move all 4 extremities, propelled in wheelchair by others, fall risk + Skin: Skin is warm and dry. keratosis on face- chronic.  Psychiatric: Poor insight, poor judgement, dementia   Labs- 02/08/15 wbc 2.6, hb 10.3, hct 29.8, plt 164, mcv 90.6, na 142, k 4.1, bun 30, cr 0.75, alb 2.6, ca 8.1  CBC Latest Ref Rng 06/12/2015 02/08/2015 04/18/2014  WBC - 11.2 2.6 2.0  Hemoglobin 12.0 - 16.0 g/dL 14.3 10.3(A) 10.4(A)  Hematocrit 36 - 46 % 43 30(A) 33(A)  Platelets 150 - 399 K/L 227 164 153   CMP     Component Value Date/Time   NA 142 02/08/2015   NA 137 08/04/2012 0615   K 4.1 02/08/2015   CL 103 08/04/2012 0615    CO2 21 08/04/2012 0615   GLUCOSE 75 08/04/2012 0615   BUN 30* 02/08/2015   BUN 22 08/04/2012 0615   CREATININE 0.8 02/08/2015   CREATININE 0.79 08/04/2012 0615   CALCIUM 8.2* 08/04/2012 0615   PROT 6.2 05/25/2008 1022   ALBUMIN 3.7 05/25/2008 1022   AST 11* 02/08/2015   ALT 6* 04/18/2014   ALKPHOS 48 02/08/2015   BILITOT 0.7 05/25/2008 1022   GFRNONAA 75* 08/04/2012 0615   GFRAA 87* 08/04/2012 0615   Lab Results  Component Value Date   TSH 0.50 11/08/2014    Assessment/plan  Uncontrolled  Hypertension bp remains elevated. Had increase in clonidine patch to 0.2 mg weekly at beginning of this month. change this to 0.3 mg weekly patch. Continue lisinopril 10 mg daily. bp reading elevated. Monitor BP bid x 1 week. Check bmp  PBA Persists but under control with nudexta, continue this  Dementia with behavior disturbance Advanced, on exelon patch. Continue celexa 20 mg daily and lorazepam 0.25 mg q6h prn anxiety. Continue nudexta  Senile osteoporosis Continue oscal with vitamin d and fall precautions  OA Continue prn tylenol with oscal and vitamin d supplement  Iron def anemia Reviewed Hb from 10/16. D/c iron supplement and monitor cbc in 3 months.   Skin tear right arm Resolved, continue to wear geri sleeves, continue skin care  Blanchie Serve, MD  [facility] Adult Medicine [phone] (Monday-Friday 8 am - 5 pm) [phone] (afterhours)

## 2015-10-11 ENCOUNTER — Other Ambulatory Visit: Payer: Self-pay | Admitting: *Deleted

## 2015-10-11 MED ORDER — LORAZEPAM 0.5 MG PO TABS: ORAL_TABLET | ORAL | Status: DC

## 2015-10-11 NOTE — Telephone Encounter (Signed)
[facility] 

## 2015-11-03 ENCOUNTER — Encounter: Payer: Self-pay | Admitting: Internal Medicine

## 2015-11-03 ENCOUNTER — Non-Acute Institutional Stay (SKILLED_NURSING_FACILITY): Payer: Medicare Other | Admitting: Internal Medicine

## 2015-11-03 DIAGNOSIS — N390 Urinary tract infection, site not specified: Secondary | ICD-10-CM

## 2015-11-03 DIAGNOSIS — L602 Onychogryphosis: Secondary | ICD-10-CM | POA: Diagnosis not present

## 2015-11-03 DIAGNOSIS — F329 Major depressive disorder, single episode, unspecified: Secondary | ICD-10-CM

## 2015-11-03 NOTE — Progress Notes (Signed)
Patient ID: Morgan [name], female   DOB: [birth year], 82 y.o.   MRN: [id number]     Location:  [place] Room Number: [place] of Service:  SNF (31)   Blanchie Serve, MD  Patient Care Team: Blanchie Serve, MD as PCP - General (Internal Medicine)  Extended Emergency Contact Information Primary Emergency Contact: Collins,Linda Address: [address]          [place] [address] Johnnette Litter of [place] Phone: [phone] Mobile Phone: [phone] Relation: Daughter  Code Status:  DNR  Goals of care: Advanced Directive information Advanced Directives 08/05/2012  Does patient have an advance directive? Patient has advance directive, copy not in chart  Type of Advance Directive Healthcare Power of Attorney  Copy of advanced directive(s) in chart? Copy requested from family     Chief Complaint  Patient presents with  . Medical Management of Chronic Issues    Routine Visit    Allergies  Allergen Reactions  . Codeine Nausea And Vomiting    HPI:  Pt is a 82 y.o. female seen today for medical management of chronic diseases. She has been at her baseline. No new concern from staff. She is under total care. She is intermittently confused. No falls reported. right arm skin tear has resolved and now has new left fore arm skin tear.   ROS Unable to obtain from patient   Past Medical History  Diagnosis Date  . Hypertension   . Dementia   . Chronic kidney disease   . Cancer (HCC)     HX OF BREAST CANCER  . Arthritis   . Macular degeneration   . Anxiety state, unspecified   . Unspecified constipation   . Osteoporosis, unspecified   . Abnormality of gait   . Fracture of neck of femur ([place])   . Peripheral vascular complications   . Personal history of fall   . Aftercare for healing traumatic fracture of hip   . Long term (current) use of anticoagulants   . Anemia   . Depression    Past Surgical History  Procedure  Laterality Date  . Lumpectormy    . Hip arthroplasty  08/01/2012    Procedure: ARTHROPLASTY BIPOLAR HIP;  Surgeon: Nita Sells, MD;  Location: [place];  Service: Orthopedics;  Laterality: Left;  Surgeon requests to follow 1st case.  . Abdominal hysterectomy    . Fracture surgery Left 07/2012    hip       Medication List       This list is accurate as of: 11/03/15  4:20 PM.  Always use your most recent med list.               acetaminophen 325 MG tablet  Commonly known as:  TYLENOL  Take 650 mg by mouth 2 (two) times daily at 10 AM and 5 PM.     calcium-vitamin D 500-200 MG-UNIT tablet  Commonly known as:  OSCAL WITH D  Take 1 tablet by mouth daily with breakfast.     citalopram 20 MG tablet  Commonly known as:  CELEXA  Take 20 mg by mouth daily. For depression/ anxiety.     cloNIDine 0.2 mg/24hr patch  Commonly known as:  CATAPRES - Dosed in mg/24 hr  Place 0.2 mg onto the skin once a week.     Cranberry 200 MG Caps  Take 400 mg by mouth 2 (two) times daily. For UTI prevention  D3-1000 1000 units tablet  Generic drug:  Cholecalciferol  Take 2,000 Units by mouth daily.     lisinopril 10 MG tablet  Commonly known as:  PRINIVIL,ZESTRIL  Take 10 mg by mouth daily. Hold for SBP < 120     LORazepam 0.5 MG tablet  Commonly known as:  ATIVAN  Take 1/2 tablet by mouth twice daily for anxiety; Take 1/2 tablet by mouth every 6 hours as needed for anxiety     NUEDEXTA 20-10 MG Caps  Generic drug:  Dextromethorphan-Quinidine  Take 1 capsule by mouth 2 (two) times daily.     rivastigmine 9.5 mg/24hr  Commonly known as:  EXELON  Place 9.5 mg onto the skin daily. Rotate sites, for dementia     sennosides-docusate sodium 8.6-50 MG tablet  Commonly known as:  SENOKOT-S  Take 2 tablets by mouth 2 (two) times daily.     UNABLE TO FIND  Med Name: Med Pass 60 mL 3 times a day due to low albumin         Immunization History  Administered Date(s)  Administered  . Influenza-Unspecified 06/02/2013, 06/15/2014  . PPD Test 05/21/2013, 05/23/2014, 05/16/2015  . Pneumococcal-Unspecified 06/02/2014   Pertinent  Health Maintenance Due  Topic Date Due  . INFLUENZA VACCINE  09/09/2016 (Originally 04/10/2015)  . PNA vac Low Risk Adult (2 of 2 - PCV13) 09/09/2016 (Originally 06/03/2015)  . DEXA SCAN  Completed    Physical exam: Filed Vitals:   11/03/15 1442  BP: 156/70  Pulse: 64  Temp: 98.8 F (37.1 C)  TempSrc: Oral  Resp: 18  Height: 5\' 5"  (1.651 m)  Weight: 117 lb 14.4 oz (53.479 kg)   Body mass index is 19.62 kg/(m^2).  Constitutional: elderly frail female in no acute distress.  Head: Normocephalic.   Mouth/Throat: Oropharynx is clear and moist.   Eyes: Conjunctivae are normal. PERRLA. Wears glasses Neck: Normal range of motion. Neck supple.   Cardiovascular: Normal s1,s2, diminished distal pulses, no leg edema   Pulmonary/Chest: Effort normal and breath sounds normal.   Abdominal: Soft. Bowel sounds are normal. She exhibits no distension and no mass.  Musculoskeletal: Able to move all 4 extremities, propelled in wheelchair by others Skin: Skin is warm and dry. keratosis on face- chronic. Skin tear to left forearm, has geri sleeve Psychiatric: Poor insight, poor judgement, dementia  Nails: long fingernails with dirt in nails   Labs reviewed:  Recent Labs  02/08/15  NA 142  K 4.1  BUN 30*  CREATININE 0.8    Recent Labs  02/08/15  AST 11*  ALKPHOS 48    Recent Labs  02/08/15 06/12/15  WBC 2.6 11.2  HGB 10.3* 14.3  HCT 30* 43  PLT 164 227   Lab Results  Component Value Date   TSH 0.50 11/08/2014    Assessment/Plan  Recurrent uti Remains free of symptom and signs, maintain hydration. Continue cranberry supplement  Chronic depression Continue celexa 20 mg daily and lorazepam 0.25 mg q6h prn anxiety.   Overgrown fingernails Will need fingernails trimmed    Blanchie Serve, MD Internal  Medicine [facility] [address] [place], [place] [address] Cell Phone (Monday-Friday 8 am - 5 pm): [phone] On Call: [phone] and follow prompts after 5 pm and on weekends Office Phone: [phone] Office Fax: [phone]

## 2015-11-28 LAB — BASIC METABOLIC PANEL
BUN: 28 mg/dL — AB (ref 4–21)
CREATININE: 0.9 mg/dL (ref 0.5–1.1)
Glucose: 99 mg/dL
POTASSIUM: 4.4 mmol/L (ref 3.4–5.3)
SODIUM: 142 mmol/L (ref 137–147)

## 2015-11-28 LAB — CBC AND DIFFERENTIAL
HCT: 34 % — AB (ref 36–46)
Hemoglobin: 11.3 g/dL — AB (ref 12.0–16.0)
Platelets: 187 10*3/uL (ref 150–399)
WBC: 3.3 10^3/mL

## 2015-11-28 LAB — HEPATIC FUNCTION PANEL
ALK PHOS: 71 U/L (ref 25–125)
ALT: 3 U/L — AB (ref 7–35)
AST: 11 U/L — AB (ref 13–35)
Bilirubin, Total: 0.6 mg/dL

## 2015-11-28 LAB — HEMOGLOBIN A1C: HEMOGLOBIN A1C: 5.2

## 2015-11-30 ENCOUNTER — Non-Acute Institutional Stay (SKILLED_NURSING_FACILITY): Payer: Medicare Other | Admitting: Internal Medicine

## 2015-11-30 ENCOUNTER — Encounter: Payer: Self-pay | Admitting: Internal Medicine

## 2015-11-30 DIAGNOSIS — F482 Pseudobulbar affect: Secondary | ICD-10-CM | POA: Diagnosis not present

## 2015-11-30 DIAGNOSIS — H1011 Acute atopic conjunctivitis, right eye: Secondary | ICD-10-CM | POA: Diagnosis not present

## 2015-11-30 DIAGNOSIS — F0390 Unspecified dementia without behavioral disturbance: Secondary | ICD-10-CM

## 2015-11-30 DIAGNOSIS — I10 Essential (primary) hypertension: Secondary | ICD-10-CM

## 2015-11-30 DIAGNOSIS — F03C0 Unspecified dementia, severe, without behavioral disturbance, psychotic disturbance, mood disturbance, and anxiety: Secondary | ICD-10-CM

## 2015-11-30 NOTE — Progress Notes (Signed)
Patient ID: Morgan [name], female   DOB: October 10, 1926, 82 y.o.   MRN: [id number]     Location:  [place] Room Number: 901-B Place of Service:  SNF (31)   Bubba Camp, [facility], MD  Patient Care Team: Blanchie Serve, MD as PCP - General (Internal Medicine)  Extended Emergency Contact Information Primary Emergency Contact: Collins,Linda Address: [address]          [place] [address] Johnnette Litter of [place] Phone: [phone] Mobile Phone: [phone] Relation: Daughter  Code Status:  DNR  Goals of care: Advanced Directive information Advanced Directives 08/05/2012  Does patient have an advance directive? Patient has advance directive, copy not in chart  Type of Advance Directive Healthcare Power of Attorney  Copy of advanced directive(s) in chart? Copy requested from family     Chief Complaint  Patient presents with  . Medical Management of Chronic Issues    Routine Visit    Allergies  Allergen Reactions  . Codeine Nausea And Vomiting    HPI:  Pt is a 82 y.o. female seen today for medical management of chronic diseases. She has been at her baseline. She is under total care. She is intermittently confused. No falls reported. No new concern from staff.   ROS Unable to obtain from patient   Past Medical History  Diagnosis Date  . Hypertension   . Dementia   . Chronic kidney disease   . Cancer (HCC)     HX OF BREAST CANCER  . Arthritis   . Macular degeneration   . Anxiety state, unspecified   . Unspecified constipation   . Osteoporosis, unspecified   . Abnormality of gait   . Fracture of neck of femur ([place])   . Peripheral vascular complications   . Personal history of fall   . Aftercare for healing traumatic fracture of hip   . Long term (current) use of anticoagulants   . Anemia   . Depression    Past Surgical History  Procedure Laterality Date  . Lumpectormy    . Hip arthroplasty  08/01/2012   Procedure: ARTHROPLASTY BIPOLAR HIP;  Surgeon: Nita Sells, MD;  Location: [place];  Service: Orthopedics;  Laterality: Left;  Surgeon requests to follow 1st case.  . Abdominal hysterectomy    . Fracture surgery Left 07/2012    hip       Medication List       This list is accurate as of: 11/30/15  4:10 PM.  Always use your most recent med list.               acetaminophen 325 MG tablet  Commonly known as:  TYLENOL  Take 650 mg by mouth 2 (two) times daily at 10 AM and 5 PM.     calcium-vitamin D 500-200 MG-UNIT tablet  Commonly known as:  OSCAL WITH D  Take 1 tablet by mouth daily with breakfast.     citalopram 20 MG tablet  Commonly known as:  CELEXA  Take 20 mg by mouth daily. For depression/ anxiety.     cloNIDine 0.3 mg/24hr patch  Commonly known as:  CATAPRES - Dosed in mg/24 hr  Place 0.3 mg onto the skin once a week.     Cranberry 200 MG Caps  Take 400 mg by mouth 2 (two) times daily. For UTI prevention     D3-1000 1000 units tablet  Generic drug:  Cholecalciferol  Take 2,000 Units by mouth daily.  lisinopril 10 MG tablet  Commonly known as:  PRINIVIL,ZESTRIL  Take 10 mg by mouth daily. Hold for SBP < 120     LORazepam 0.5 MG tablet  Commonly known as:  ATIVAN  Take 1/2 tablet by mouth twice daily for anxiety; Take 1/2 tablet by mouth every 6 hours as needed for anxiety     NUEDEXTA 20-10 MG Caps  Generic drug:  Dextromethorphan-Quinidine  Take 1 capsule by mouth 2 (two) times daily.     rivastigmine 9.5 mg/24hr  Commonly known as:  EXELON  Place 9.5 mg onto the skin daily. Rotate sites, for dementia     sennosides-docusate sodium 8.6-50 MG tablet  Commonly known as:  SENOKOT-S  Take 2 tablets by mouth 2 (two) times daily.     UNABLE TO FIND  Med Name: Med Pass 60 mL 3 times a day due to low albumin         Immunization History  Administered Date(s) Administered  . Influenza-Unspecified 06/02/2013, 06/15/2014  . PPD Test  05/21/2013, 05/23/2014, 05/16/2015  . Pneumococcal-Unspecified 06/02/2014   Pertinent  Health Maintenance Due  Topic Date Due  . INFLUENZA VACCINE  09/09/2016 (Originally 04/10/2015)  . PNA vac Low Risk Adult (2 of 2 - PCV13) 09/09/2016 (Originally 06/03/2015)  . DEXA SCAN  Completed    Physical exam: Filed Vitals:   11/30/15 1605  BP: 186/93  Pulse: 67  Temp: 98.3 F (36.8 C)  TempSrc: Oral  Resp: 20  Height: 5\' 5"  (1.651 m)  Weight: 117 lb 14.4 oz (53.479 kg)  SpO2: 93%   Body mass index is 19.62 kg/(m^2).  Constitutional: elderly frail thin built female in no acute distress.  Head: Normocephalic.   Mouth/Throat: Oropharynx is clear and moist.   Eyes: mild erythema noted to right eye conjunctiva. Clear discharge to right eye Neck: Normal range of motion. Neck supple.   Cardiovascular: Normal s1,s2, diminished distal pulses, no leg edema   Pulmonary/Chest: Effort normal and breath sounds normal.   Abdominal: Soft. Bowel sounds are normal.  Musculoskeletal: Able to move all 4 extremities, propelled in wheelchair by others Skin: Skin is warm and dry. keratosis on face Psychiatric: Poor insight, poor judgement, dementia    Labs reviewed:  Recent Labs  02/08/15  NA 142  K 4.1  BUN 30*  CREATININE 0.8    Recent Labs  02/08/15  AST 11*  ALKPHOS 48    Recent Labs  02/08/15 06/12/15  WBC 2.6 11.2  HGB 10.3* 14.3  HCT 30* 43  PLT 164 227   Lab Results  Component Value Date   TSH 0.50 11/08/2014    Assessment/Plan  Allergic conjunctivitis Start pataday eye drop to right eye once a day x 1 week and reassess  HTN Elevated bp, continue clonidine patch 0.3 mg weekly patch. Change lisinopril to 20 mg daily. Monitor BP twice a week x 1 month.   Advanced dementia with behavioral disturbances Continue exelon patch and celexa  PBA Continue neudexta, has been helpful    Blanchie Serve, MD Internal Medicine [facility] [address] [place], [place] [address] Cell Phone (Monday-Friday 8 am - 5 pm): [phone] On Call: [phone] and follow prompts after 5 pm and on weekends Office Phone: [phone] Office Fax: [phone]

## 2016-01-03 ENCOUNTER — Encounter: Payer: Self-pay | Admitting: Internal Medicine

## 2016-01-03 ENCOUNTER — Non-Acute Institutional Stay (SKILLED_NURSING_FACILITY): Payer: Medicare Other | Admitting: Internal Medicine

## 2016-01-03 DIAGNOSIS — M159 Polyosteoarthritis, unspecified: Secondary | ICD-10-CM | POA: Diagnosis not present

## 2016-01-03 DIAGNOSIS — I10 Essential (primary) hypertension: Secondary | ICD-10-CM | POA: Diagnosis not present

## 2016-01-03 DIAGNOSIS — M24521 Contracture, right elbow: Secondary | ICD-10-CM | POA: Insufficient documentation

## 2016-01-03 DIAGNOSIS — Z0000 Encounter for general adult medical examination without abnormal findings: Secondary | ICD-10-CM

## 2016-01-03 DIAGNOSIS — M810 Age-related osteoporosis without current pathological fracture: Secondary | ICD-10-CM

## 2016-01-03 DIAGNOSIS — E46 Unspecified protein-calorie malnutrition: Secondary | ICD-10-CM

## 2016-01-03 DIAGNOSIS — F329 Major depressive disorder, single episode, unspecified: Secondary | ICD-10-CM | POA: Insufficient documentation

## 2016-01-03 DIAGNOSIS — F0391 Unspecified dementia with behavioral disturbance: Secondary | ICD-10-CM

## 2016-01-03 DIAGNOSIS — F03918 Unspecified dementia, unspecified severity, with other behavioral disturbance: Secondary | ICD-10-CM

## 2016-01-03 LAB — BASIC METABOLIC PANEL
BUN: 17 mg/dL (ref 4–21)
CREATININE: 0.7 mg/dL (ref 0.5–1.1)
Glucose: 104 mg/dL
Potassium: 4.5 mmol/L (ref 3.4–5.3)
Sodium: 140 mmol/L (ref 137–147)

## 2016-01-03 LAB — HEPATIC FUNCTION PANEL
ALK PHOS: 61 U/L (ref 25–125)
ALT: 5 U/L — AB (ref 7–35)
AST: 9 U/L — AB (ref 13–35)
Bilirubin, Total: 0.4 mg/dL

## 2016-01-03 NOTE — Progress Notes (Signed)
Patient ID: Morgan [name], female   DOB: Apr 30, 1927, 82 y.o.   MRN: [id number]     Location:  [place] Room Number: 901-B Place of Service:  SNF (31)   Bubba Camp, [facility], MD  Patient Care Team: Blanchie Serve, MD as PCP - General (Internal Medicine)  Extended Emergency Contact Information Primary Emergency Contact: Collins,Linda Address: [address]          [place] [address] Johnnette Litter of [place] Phone: [phone] Mobile Phone: [phone] Relation: Daughter  Code Status:  DNR  Goals of care: Advanced Directive information Advanced Directives 08/05/2012  Does patient have an advance directive? Patient has advance directive, copy not in chart  Type of Advance Directive Healthcare Power of Attorney  Copy of advanced directive(s) in chart? Copy requested from family     Chief Complaint  Patient presents with  . Annual Exam    Annual Visit    Allergies  Allergen Reactions  . Codeine Nausea And Vomiting    HPI:  Pt is a 82 y.o. female seen today for annual exam. She is under total care. She is a long term care resident here. She has advanced dementia with intermittent confusion. No falls reported. No new skin concern from staff. She gets out of bed almost everyday. Reviewed her labs, immunization and weight chart.   ROS Unable to obtain from patient   Past Medical History  Diagnosis Date  . Hypertension   . Dementia   . Chronic kidney disease   . Cancer (HCC)     HX OF BREAST CANCER  . Arthritis   . Macular degeneration   . Anxiety state, unspecified   . Unspecified constipation   . Osteoporosis, unspecified   . Abnormality of gait   . Fracture of neck of femur ([place])   . Peripheral vascular complications   . Personal history of fall   . Aftercare for healing traumatic fracture of hip   . Long term (current) use of anticoagulants   . Anemia   . Depression    Past Surgical History  Procedure  Laterality Date  . Lumpectormy    . Hip arthroplasty  08/01/2012    Procedure: ARTHROPLASTY BIPOLAR HIP;  Surgeon: Nita Sells, MD;  Location: [place];  Service: Orthopedics;  Laterality: Left;  Surgeon requests to follow 1st case.  . Abdominal hysterectomy    . Fracture surgery Left 07/2012    hip       Medication List       This list is accurate as of: 01/03/16  2:24 PM.  Always use your most recent med list.               acetaminophen 325 MG tablet  Commonly known as:  TYLENOL  Take 650 mg by mouth 2 (two) times daily at 10 AM and 5 PM.     calcium-vitamin D 500-200 MG-UNIT tablet  Commonly known as:  OSCAL WITH D  Take 1 tablet by mouth daily with breakfast.     citalopram 20 MG tablet  Commonly known as:  CELEXA  Take 20 mg by mouth daily. For depression/ anxiety.     cloNIDine 0.3 mg/24hr patch  Commonly known as:  CATAPRES - Dosed in mg/24 hr  Place 0.3 mg onto the skin once a week.     Cranberry 200 MG Caps  Take 400 mg by mouth 2 (two) times daily. For UTI prevention     D3-1000 1000  units tablet  Generic drug:  Cholecalciferol  Take 2,000 Units by mouth daily.     lisinopril 20 MG tablet  Commonly known as:  PRINIVIL,ZESTRIL  Take 20 mg by mouth daily.     LORazepam 0.5 MG tablet  Commonly known as:  ATIVAN  Take 1/2 tablet by mouth twice daily for anxiety; Take 1/2 tablet by mouth every 6 hours as needed for anxiety     NUEDEXTA 20-10 MG Caps  Generic drug:  Dextromethorphan-Quinidine  Take 1 capsule by mouth 2 (two) times daily.     rivastigmine 9.5 mg/24hr  Commonly known as:  EXELON  Place 9.5 mg onto the skin daily. Rotate sites, for dementia     sennosides-docusate sodium 8.6-50 MG tablet  Commonly known as:  SENOKOT-S  Take 2 tablets by mouth 2 (two) times daily.     UNABLE TO FIND  Med Name: Med Pass 60 mL 3 times a day due to low albumin         Immunization History  Administered Date(s) Administered  .  Influenza-Unspecified 06/02/2013, 06/15/2014  . PPD Test 05/21/2013, 05/23/2014, 05/16/2015  . Pneumococcal-Unspecified 06/02/2014   Pertinent  Health Maintenance Due  Topic Date Due  . INFLUENZA VACCINE  09/09/2016 (Originally 04/09/2016)  . PNA vac Low Risk Adult (2 of 2 - PCV13) 09/09/2016 (Originally 06/03/2015)  . DEXA SCAN  Completed    Physical exam: Filed Vitals:   01/03/16 1403  BP: 162/69  Pulse: 61  Temp: 98.4 F (36.9 C)  TempSrc: Oral  Resp: 20  Height: 5\' 5"  (1.651 m)  Weight: 117 lb 1.6 oz (53.116 kg)  SpO2: 95%   Body mass index is 19.49 kg/(m^2).  Constitutional: elderly frail thin built female in no acute distress.  Head: Normocephalic. Atraumatic.  Mouth/Throat: Oropharynx is clear and moist.   Eyes: PERRLA, normal sclera and conjunctiva Neck: Normal range of motion. Neck supple.  No JVD Cardiovascular: Normal s1,s2, murmur +, diminished distal pulses, no leg edema   Pulmonary/Chest: Effort normal and breath sounds normal.   Abdominal: Soft. Bowel sounds are normal. Non distended.  Musculoskeletal: Able to move all 4 extremities, contracture to right elbow with soft splint present, moderate muscle wasting present. propelled in wheelchair by others Skin: Skin is warm and dry. keratosis on face Psychiatric: Poor insight, poor judgement, dementia    Labs reviewed:  Recent Labs  02/08/15 11/28/15  NA 142 142  K 4.1 4.4  BUN 30* 28*  CREATININE 0.8 0.9    Recent Labs  02/08/15 11/28/15  AST 11* 11*  ALT  --  3*  ALKPHOS 48 71    Recent Labs  02/08/15 06/12/15 11/28/15  WBC 2.6 11.2 3.3  HGB 10.3* 14.3 11.3*  HCT 30* 43 34*  PLT 164 227 187   Lab Results  Component Value Date   TSH 0.50 11/08/2014   Lab Results  Component Value Date   HGBA1C 5.2 11/28/2015   Lipid Panel  No results found for: CHOL, TRIG, HDL, CHOLHDL, VLDL, LDLCALC, LDLDIRECT   Assessment/Plan  Annual exam Has advanced dementia with poor overall prognosis.  Decline anticipated. Continue total care. uptodate with immunization. Reviewed lab and medication  Advanced dementia Under total care. Decline anticipated. Fall precautions. Pressure ulcer prophylaxis. Continue exelon patch  Protein calorie malnutrition Reviewed her weight, decline anticipated, assistance with feeding, aspiration precautions, continue protein supplement  chronic depression Stable mood. Continue celexa for now  HTN Elevated bp readings on review. Currently on clonidine patch  0.3 mg weekly patch and lisinopril20 mg daily. Change lisinopril to 30 mg daily and monitor.   OA Continue tylenol 650 mg bid and monitor  Senile osteoporosis With history of fracture. Continue vitamin d and calcium supplement  Right elbow contracture Continue splint, gel overlay mattress with limited mobility and pressure ulcer prophylaxis   Blanchie Serve, MD Internal Medicine [facility] [address] [place], [place] [address] Cell Phone (Monday-Friday 8 am - 5 pm): [phone] On Call: [phone] and follow prompts after 5 pm and on weekends Office Phone: [phone] Office Fax: [phone]

## 2016-02-01 ENCOUNTER — Encounter: Payer: Self-pay | Admitting: Internal Medicine

## 2016-02-01 ENCOUNTER — Non-Acute Institutional Stay (SKILLED_NURSING_FACILITY): Payer: Medicare Other | Admitting: Internal Medicine

## 2016-02-01 DIAGNOSIS — E46 Unspecified protein-calorie malnutrition: Secondary | ICD-10-CM

## 2016-02-01 DIAGNOSIS — H3530 Unspecified macular degeneration: Secondary | ICD-10-CM | POA: Diagnosis not present

## 2016-02-01 DIAGNOSIS — M159 Polyosteoarthritis, unspecified: Secondary | ICD-10-CM

## 2016-02-01 NOTE — Progress Notes (Signed)
Patient ID: Morgan [name], female   DOB: 02/21/1927, 82 y.o.   MRN: [id number]     Location:  [facility] Room Number: 901-B Place of Service:  SNF (31)   Bubba Camp, [facility], MD  Patient Care Team: Blanchie Serve, MD as PCP - General (Internal Medicine)  Extended Emergency Contact Information Primary Emergency Contact: Collins,Linda Address: [address]          [place] [address] [place] of [place] Phone: [phone] Mobile Phone: [phone] Relation: Daughter  Code Status:  DNR  Goals of care: Advanced Directive information Advanced Directives 02/01/2016  Does patient have an advance directive? Yes  Type of Advance Directive Out of facility DNR (pink MOST or yellow form)  Does patient want to make changes to advanced directive? No - Patient declined  Copy of advanced directive(s) in chart? Yes     Chief Complaint  Patient presents with  . Medical Management of Chronic Issues    Routine Visit    Allergies  Allergen Reactions  . Codeine Nausea And Vomiting    HPI:  Pt is a 82 y.o. female seen today for routine visit. She has been at her baseline. She is unable to participate in HPI and ROS. She is under total care.    ROS Unable to obtain from patient   Past Medical History  Diagnosis Date  . Hypertension   . Dementia   . Chronic kidney disease   . Cancer (HCC)     HX OF BREAST CANCER  . Arthritis   . Macular degeneration   . Anxiety state, unspecified   . Unspecified constipation   . Osteoporosis, unspecified   . Abnormality of gait   . Fracture of neck of femur ([place])   . Peripheral vascular complications   . Personal history of fall   . Aftercare for healing traumatic fracture of hip   . Long term (current) use of anticoagulants   . Anemia   . Depression    Past Surgical History  Procedure Laterality Date  . Lumpectormy    . Hip arthroplasty  08/01/2012    Procedure: ARTHROPLASTY BIPOLAR  HIP;  Surgeon: Nita Sells, MD;  Location: [facility];  Service: Orthopedics;  Laterality: Left;  Surgeon requests to follow 1st case.  . Abdominal hysterectomy    . Fracture surgery Left 07/2012    hip       Medication List       This list is accurate as of: 02/01/16  2:05 PM.  Always use your most recent med list.               acetaminophen 325 MG tablet  Commonly known as:  TYLENOL  Take 650 mg by mouth 2 (two) times daily at 10 AM and 5 PM.     calcium-vitamin D 500-200 MG-UNIT tablet  Commonly known as:  OSCAL WITH D  Take 1 tablet by mouth daily with breakfast.     citalopram 20 MG tablet  Commonly known as:  CELEXA  Take 20 mg by mouth daily. For depression/ anxiety.     cloNIDine 0.3 mg/24hr patch  Commonly known as:  CATAPRES - Dosed in mg/24 hr  Place 0.3 mg onto the skin once a week.     Cranberry 200 MG Caps  Take 400 mg by mouth 2 (two) times daily. For UTI prevention     cyanocobalamin 500 MCG tablet  Take 500 mcg by mouth daily.  D3-1000 1000 units tablet  Generic drug:  Cholecalciferol  Take 2,000 Units by mouth daily.     lisinopril 30 MG tablet  Commonly known as:  PRINIVIL,ZESTRIL  Take 30 mg by mouth daily.     LORazepam 0.5 MG tablet  Commonly known as:  ATIVAN  Take 1/2 tablet by mouth twice daily for anxiety; Take 1/2 tablet by mouth every 6 hours as needed for anxiety     NUEDEXTA 20-10 MG Caps  Generic drug:  Dextromethorphan-Quinidine  Take 1 capsule by mouth 2 (two) times daily.     PRESERVISION AREDS PO  Take 1 capsule by mouth 2 (two) times daily.     rivastigmine 9.5 mg/24hr  Commonly known as:  EXELON  Place 9.5 mg onto the skin daily. Rotate sites, for dementia     sennosides-docusate sodium 8.6-50 MG tablet  Commonly known as:  SENOKOT-S  Take 2 tablets by mouth 2 (two) times daily.     UNABLE TO FIND  Med Name: Med Pass 60 mL 3 times a day due to low albumin         Immunization History  Administered  Date(s) Administered  . Influenza-Unspecified 06/02/2013, 06/15/2014  . PPD Test 05/21/2013, 05/23/2014, 05/16/2015  . Pneumococcal-Unspecified 06/02/2014   Pertinent  Health Maintenance Due  Topic Date Due  . INFLUENZA VACCINE  09/09/2016 (Originally 04/09/2016)  . PNA vac Low Risk Adult (2 of 2 - PCV13) 09/09/2016 (Originally 06/03/2015)  . DEXA SCAN  Completed    Physical exam: Filed Vitals:   02/01/16 1353  BP: 133/63  Pulse: 66  Temp: 97 F (36.1 C)  TempSrc: Oral  Resp: 18  Height: 5\' 5"  (1.651 m)  Weight: 120 lb 6.4 oz (54.613 kg)  SpO2: 94%   Body mass index is 20.04 kg/(m^2).  Constitutional: elderly frail thin built female in no acute distress.  Head: Normocephalic. Atraumatic.  Mouth/Throat: Oropharynx is clear and moist.   Neck: Neck supple.  Cardiovascular: Normal s1,s2, murmur +, diminished distal pulses, no leg edema   Pulmonary/Chest: Effort normal and breath sounds normal.   Abdominal: Soft. Bowel sounds are normal. Non distended.  Musculoskeletal: Able to move all 4 extremities, contracture to right elbow with soft splint present, moderate muscle wasting present. propelled in wheelchair by others Skin: Skin is warm and dry. keratosis on face Psychiatric: Poor insight, poor judgement, dementia    Labs reviewed:  Recent Labs  02/08/15 11/28/15 01/03/16  NA 142 142 140  K 4.1 4.4 4.5  BUN 30* 28* 17  CREATININE 0.8 0.9 0.7    Recent Labs  02/08/15 11/28/15 01/03/16  AST 11* 11* 9*  ALT  --  3* 5*  ALKPHOS 48 71 61    Recent Labs  02/08/15 06/12/15 11/28/15  WBC 2.6 11.2 3.3  HGB 10.3* 14.3 11.3*  HCT 30* 43 34*  PLT 164 227 187   Lab Results  Component Value Date   TSH 0.50 11/08/2014   Lab Results  Component Value Date   HGBA1C 5.2 11/28/2015   Lipid Panel  No results found for: CHOL, TRIG, HDL, CHOLHDL, VLDL, LDLCALC, LDLDIRECT   Assessment/Plan  Macular degeneration Continue preservision capsule bid and monitor with regular  interval eye exam  Protein calorie malnutrition Reviewed her weight, decline anticipated, assistance with feeding, aspiration precautions, continue protein supplement  OA Continue tylenol 650 mg bid and monitor    Blanchie Serve, MD Internal Medicine [facility] [address]  [place], [place] [address] Cell Phone (Monday-Friday 8 am - 5 pm): [phone] On Call: [phone] and follow prompts after 5 pm and on weekends Office Phone: [phone] Office Fax: [phone]

## 2016-02-13 LAB — BASIC METABOLIC PANEL
BUN: 27 mg/dL — AB (ref 4–21)
CREATININE: 0.9 mg/dL (ref 0.5–1.1)
Glucose: 132 mg/dL
Potassium: 4.3 mmol/L (ref 3.4–5.3)
SODIUM: 139 mmol/L (ref 137–147)

## 2016-02-13 LAB — CBC AND DIFFERENTIAL
HEMATOCRIT: 46 % (ref 36–46)
Hemoglobin: 14.5 g/dL (ref 12.0–16.0)
PLATELETS: 235 10*3/uL (ref 150–399)
WBC: 9.2 10^3/mL

## 2016-03-04 ENCOUNTER — Non-Acute Institutional Stay (SKILLED_NURSING_FACILITY): Payer: Medicare Other | Admitting: Internal Medicine

## 2016-03-04 ENCOUNTER — Encounter: Payer: Self-pay | Admitting: Internal Medicine

## 2016-03-04 DIAGNOSIS — N3090 Cystitis, unspecified without hematuria: Secondary | ICD-10-CM | POA: Insufficient documentation

## 2016-03-04 DIAGNOSIS — I1310 Hypertensive heart and chronic kidney disease without heart failure, with stage 1 through stage 4 chronic kidney disease, or unspecified chronic kidney disease: Secondary | ICD-10-CM | POA: Diagnosis not present

## 2016-03-04 DIAGNOSIS — F0150 Vascular dementia without behavioral disturbance: Secondary | ICD-10-CM | POA: Diagnosis not present

## 2016-03-04 DIAGNOSIS — M25571 Pain in right ankle and joints of right foot: Secondary | ICD-10-CM | POA: Diagnosis not present

## 2016-03-04 DIAGNOSIS — E538 Deficiency of other specified B group vitamins: Secondary | ICD-10-CM | POA: Diagnosis not present

## 2016-03-04 DIAGNOSIS — N3080 Other cystitis without hematuria: Secondary | ICD-10-CM

## 2016-03-04 NOTE — Progress Notes (Signed)
Patient ID: Morgan [name], female   DOB: 04-27-1927, 82 y.o.   MRN: [id number]     [facility] Room Number: [id number] Place of Service:  SNF (31)   Blanchie Serve, MD  Patient Care Team: Blanchie Serve, MD as PCP - General (Internal Medicine)  Extended Emergency Contact Information Primary Emergency Contact: Collins,Linda Address: [address]           [address] [place] Phone: [phone] Mobile Phone: [phone] Relation: Daughter  Code Status:  DNR  Goals of care: Advanced Directive information Advanced Directives 02/01/2016  Does patient have an advance directive? Yes  Type of Advance Directive Out of facility DNR (pink MOST or yellow form)  Does patient want to make changes to advanced directive? No - Patient declined  Copy of advanced directive(s) in chart? Yes     Chief Complaint  Patient presents with  . Medical Management of Chronic Issues    Routine Visit    Allergies  Allergen Reactions  . Codeine Nausea And Vomiting    HPI:  Pt is a 82 y.o. female seen today for routine visit. She complaints of pain to her right ankle area on movement/ transfer by aid. She has been at her baseline otherwise with her advanced dementia and is under total care. No new skin concern. No witnessed fall reported. She is unable to participate in HPI and ROS.    ROS Unable to obtain from patient   Past Medical History  Diagnosis Date  . Hypertension   . Dementia   . Chronic kidney disease   . Cancer (HCC)     HX OF BREAST CANCER  . Arthritis   . Macular degeneration   . Anxiety state, unspecified   . Unspecified constipation   . Osteoporosis, unspecified   . Abnormality of gait   . Fracture of neck of femur ([place])   . Peripheral vascular complications   . Personal history of fall   . Aftercare for healing traumatic fracture of hip   . Long term (current) use of anticoagulants   . Anemia   . Depression    Past  Surgical History  Procedure Laterality Date  . Lumpectormy    . Hip arthroplasty  08/01/2012    Procedure: ARTHROPLASTY BIPOLAR HIP;  Surgeon: Nita Sells, MD;  [place] [facility];  Service: Orthopedics;  Laterality: Left;  Surgeon requests to follow 1st case.  . Abdominal hysterectomy    . Fracture surgery Left 07/2012    hip       Medication List       This list is accurate as of: 03/04/16  3:00 PM.  Always use your most recent med list.               acetaminophen 325 MG tablet  Commonly known as:  TYLENOL  Take 650 mg by mouth 2 (two) times daily at 10 AM and 5 PM.     calcium-vitamin D 500-200 MG-UNIT tablet  Commonly known as:  OSCAL WITH D  Take 1 tablet by mouth daily with breakfast.     citalopram 20 MG tablet  Commonly known as:  CELEXA  Take 20 mg by mouth daily. For depression/ anxiety.     cloNIDine 0.3 mg/24hr patch  Commonly known as:  CATAPRES - Dosed in mg/24 hr  Place 0.3 mg onto the skin once a week.     Cranberry 200 MG Caps  Take 400 mg by  mouth 2 (two) times daily. For UTI prevention     cyanocobalamin 500 MCG tablet  Take 500 mcg by mouth daily.     D3-1000 1000 units tablet  Generic drug:  Cholecalciferol  Take 2,000 Units by mouth daily.     lisinopril 30 MG tablet  Commonly known as:  PRINIVIL,ZESTRIL  Take 30 mg by mouth daily.     LORazepam 0.5 MG tablet  Commonly known as:  ATIVAN  Take 1/2 tablet by mouth twice daily for anxiety; Take 1/2 tablet by mouth every 6 hours as needed for anxiety     NUEDEXTA 20-10 MG Caps  Generic drug:  Dextromethorphan-Quinidine  Take 1 capsule by mouth 2 (two) times daily.     PRESERVISION AREDS PO  Take 1 capsule by mouth 2 (two) times daily.     rivastigmine 9.5 mg/24hr  Commonly known as:  EXELON  Place 9.5 mg onto the skin daily. Rotate sites, for dementia     sennosides-docusate sodium 8.6-50 MG tablet  Commonly known as:  SENOKOT-S  Take 2 tablets by mouth 2 (two) times  daily.     UNABLE TO FIND  Med Name: Med Pass 60 mL 3 times a day due to low albumin         Immunization History  Administered Date(s) Administered  . Influenza-Unspecified 06/02/2013, 06/15/2014  . PPD Test 05/21/2013, 05/23/2014, 05/16/2015  . Pneumococcal-Unspecified 06/02/2014   Pertinent  Health Maintenance Due  Topic Date Due  . INFLUENZA VACCINE  09/09/2016 (Originally 04/09/2016)  . PNA vac Low Risk Adult (2 of 2 - PCV13) 09/09/2016 (Originally 06/03/2015)  . DEXA SCAN  Completed    Physical exam: Filed Vitals:   03/04/16 1453  BP: 132/78  Pulse: 68  Temp: 98.6 F (37 C)  TempSrc: Oral  Resp: 18  Height: 5\' 5"  (1.651 m)  Weight: 116 lb 12.8 oz (52.98 kg)  SpO2: 95%   Body mass index is 19.44 kg/(m^2).  Constitutional: elderly frail thin built female in no acute distress.  Head: Normocephalic. Atraumatic.  Mouth/Throat: Oropharynx is clear and moist.   Neck: Neck supple.  Cardiovascular: Normal s1,s2, murmur +, diminished distal pulses, trace right ankle edema   Pulmonary/Chest: Effort normal and breath sounds normal.   Abdominal: Soft. Bowel sounds are normal. Non distended.  Musculoskeletal: Able to move all 4 extremities, contracture to right elbow with soft splint present, moderate muscle wasting present. propelled in wheelchair by others, right foot flexion produces pain, swelling around right ankle present, no bruise noted Skin: Skin is warm and dry. keratosis on face, geri sleeve present Psychiatric: Poor insight, poor judgement, dementia    Labs reviewed:  Recent Labs  11/28/15 01/03/16 02/13/16  NA 142 140 139  K 4.4 4.5 4.3  BUN 28* 17 27*  CREATININE 0.9 0.7 0.9    Recent Labs  11/28/15 01/03/16  AST 11* 9*  ALT 3* 5*  ALKPHOS 71 61    Recent Labs  06/12/15 11/28/15 02/13/16  WBC 11.2 3.3 9.2  HGB 14.3 11.3* 14.5  HCT 43 34* 46  PLT 227 187 235   Lab Results  Component Value Date   TSH 0.50 11/08/2014   Lab Results    Component Value Date   HGBA1C 5.2 11/28/2015   Lipid Panel  No results found for: CHOL, TRIG, HDL, CHOLHDL, VLDL, LDLCALC, LDLDIRECT   Assessment/Plan  Right ankle pain With swelling and limited passive ROM, concern for possible muscle sprain vs fracture. Will get xray  3 view of right ankle. Continue tylenol 650 mg but change to tid for pain for now. Continue assistance with transfers and have her non weight bearing until xray is resulted  Hypertension Stable, continue clonidine and lisinopril for now and monitor blood pressure  b12 def Continue b12 supplement daily  Recurrent cystitis No complaints per nursing staff, perineal hygiene and cranberry supplement for now  Vascular dementia Continue exelon patch, supportive care and nutritional supplement, poor prognosis    Blanchie Serve, MD Internal Medicine [facility] [place], [place] [address] Cell Phone (Monday-Friday 8 am - 5 pm): [phone] On Call: [phone] and follow prompts after 5 pm and on weekends Office Phone: [phone] Office Fax: [phone]

## 2016-04-04 ENCOUNTER — Non-Acute Institutional Stay (SKILLED_NURSING_FACILITY): Payer: Medicare Other | Admitting: Internal Medicine

## 2016-04-04 ENCOUNTER — Encounter: Payer: Self-pay | Admitting: Internal Medicine

## 2016-04-04 DIAGNOSIS — J189 Pneumonia, unspecified organism: Secondary | ICD-10-CM

## 2016-04-04 DIAGNOSIS — L219 Seborrheic dermatitis, unspecified: Secondary | ICD-10-CM

## 2016-04-04 DIAGNOSIS — D692 Other nonthrombocytopenic purpura: Secondary | ICD-10-CM

## 2016-04-04 DIAGNOSIS — J181 Lobar pneumonia, unspecified organism: Principal | ICD-10-CM

## 2016-04-04 DIAGNOSIS — I739 Peripheral vascular disease, unspecified: Secondary | ICD-10-CM

## 2016-04-04 NOTE — Progress Notes (Signed)
Patient ID: Morgan [name], female   DOB: 06/07/1927, 82 y.o.   MRN: [id number]     Location:    [facility] Room Number: [id number] Place of Service:  SNF (31)   Blanchie Serve, MD  Patient Care Team: Blanchie Serve, MD as PCP - General (Internal Medicine)  Extended Emergency Contact Information Primary Emergency Contact: Collins,Linda Address: [address]           [address] [place] of [place] Phone: [phone] Mobile Phone: [phone] Relation: Daughter  Code Status:  DNR  Goals of care: Advanced Directive information Advanced Directives 02/01/2016  Does patient have an advance directive? Yes  Type of Advance Directive Out of facility DNR (pink MOST or yellow form)  Does patient want to make changes to advanced directive? No - Patient declined  Copy of advanced directive(s) in chart? Yes     Chief Complaint  Patient presents with  . Medical Management of Chronic Issues    Routine Visit    Allergies  Allergen Reactions  . Codeine Nausea And Vomiting    HPI:  Pt is a 82 y.o. female seen today for routine visit. She was treated for pneumonia and has completed her antibiotics. she does not participate in HPI and ROS. No fall reported by nursing. She is under total care.   ROS Unable to obtain from patient   Past Medical History:  Diagnosis Date  . Abnormality of gait   . Aftercare for healing traumatic fracture of hip   . Anemia   . Anxiety state, unspecified   . Arthritis   . Cancer (HCC)    HX OF BREAST CANCER  . Chronic kidney disease   . Dementia   . Depression   . Fracture of neck of femur ([place])   . Hypertension   . Long term (current) use of anticoagulants   . Macular degeneration   . Osteoporosis, unspecified   . Peripheral vascular complications   . Personal history of fall   . Unspecified constipation    Past Surgical History:  Procedure Laterality Date  . ABDOMINAL HYSTERECTOMY    . FRACTURE SURGERY Left  07/2012   hip  . HIP ARTHROPLASTY  08/01/2012   Procedure: ARTHROPLASTY BIPOLAR HIP;  Surgeon: Nita Sells, MD;  Location: [facility];  Service: Orthopedics;  Laterality: Left;  Surgeon requests to follow 1st case.  . lumpectormy         Medication List       Accurate as of 04/04/16 10:05 AM. Always use your most recent med list.          acetaminophen 325 MG tablet Commonly known as:  TYLENOL Take 650 mg by mouth 2 (two) times daily at 10 AM and 5 PM.   calcium-vitamin D 500-200 MG-UNIT tablet Commonly known as:  OSCAL WITH D Take 1 tablet by mouth daily with breakfast.   citalopram 20 MG tablet Commonly known as:  CELEXA Take 20 mg by mouth daily. For depression/ anxiety.   cloNIDine 0.3 mg/24hr patch Commonly known as:  CATAPRES - Dosed in mg/24 hr Place 0.3 mg onto the skin once a week.   Cranberry 200 MG Caps Take 400 mg by mouth 2 (two) times daily. For UTI prevention   cyanocobalamin 500 MCG tablet Take 500 mcg by mouth daily.   D3-1000 1000 units tablet Generic drug:  Cholecalciferol Take 2,000 Units by mouth daily.   lisinopril 30 MG tablet Commonly known as:  PRINIVIL,ZESTRIL Take 30 mg  by mouth daily.   LORazepam 0.5 MG tablet Commonly known as:  ATIVAN Take 1/2 tablet by mouth twice daily for anxiety; Take 1/2 tablet by mouth every 6 hours as needed for anxiety   NUEDEXTA 20-10 MG Caps Generic drug:  Dextromethorphan-Quinidine Take 1 capsule by mouth 2 (two) times daily.   PRESERVISION AREDS PO Take 1 capsule by mouth 2 (two) times daily.   rivastigmine 9.5 mg/24hr Commonly known as:  EXELON Place 9.5 mg onto the skin daily. Rotate sites, for dementia   sennosides-docusate sodium 8.6-50 MG tablet Commonly known as:  SENOKOT-S Take 2 tablets by mouth 2 (two) times daily.   UNABLE TO FIND Med Name: Med Pass 60 mL 3 times a day due to low albumin        Immunization History  Administered Date(s) Administered  .  Influenza-Unspecified 06/02/2013, 06/15/2014  . PPD Test 05/21/2013, 05/23/2014, 05/16/2015  . Pneumococcal-Unspecified 06/02/2014   Pertinent  Health Maintenance Due  Topic Date Due  . INFLUENZA VACCINE  09/09/2016 (Originally 04/09/2016)  . PNA vac Low Risk Adult (2 of 2 - PCV13) 09/09/2016 (Originally 06/03/2015)  . DEXA SCAN  Completed    Physical exam: Vitals:   04/04/16 0955  BP: 116/68  Pulse: 74  Resp: 20  Temp: 97.3 F (36.3 C)  TempSrc: Oral  SpO2: 95%  Weight: (P) 120 lb 6.4 oz (54.6 kg)  Height: (P) 5\' 5"  (1.651 m)   Body mass index is 20.04 kg/m (pended).  Constitutional: elderly frail thin built female in no acute distress.  Head: Normocephalic. Atraumatic.  Mouth/Throat: MMM Neck: Neck supple.  Cardiovascular: Normal s1,s2, murmur +, diminished distal pulses Pulmonary/Chest: Effort normal and breath sounds normal.   Abdominal: Soft. Bowel sounds are normal. Non distended.  Musculoskeletal: Able to move all 4 extremities, contracture to right elbow with soft splint present, moderate muscle wasting present.  Skin: Skin is warm and dry. keratosis on face, geri sleeve present Psychiatric: calm this visit.     Labs reviewed:  Recent Labs  11/28/15 01/03/16 02/13/16  NA 142 140 139  K 4.4 4.5 4.3  BUN 28* 17 27*  CREATININE 0.9 0.7 0.9    Recent Labs  11/28/15 01/03/16  AST 11* 9*  ALT 3* 5*  ALKPHOS 71 61    Recent Labs  06/12/15 11/28/15 02/13/16  WBC 11.2 3.3 9.2  HGB 14.3 11.3* 14.5  HCT 43 34* 46  PLT 227 187 235   Lab Results  Component Value Date   TSH 0.50 11/08/2014   Lab Results  Component Value Date   HGBA1C 5.2 11/28/2015   Lipid Panel  No results found for: CHOL, TRIG, HDL, CHOLHDL, VLDL, LDLCALC, LDLDIRECT   Assessment/Plan  Left lobe pneumonia Completed her course of levaquin, breathing stable at present, afebrile. Monitor clinically  Nonthrombocytopenic purpura Continue to provide skin care and  protection  PVD Stable, continue skin care and pressure ulcer prophylaxis.   Seborrheic dermatitis Skin care and to apply aquaphor. Monitor for signs of infection   Blanchie Serve, MD Internal Medicine [facility] [address] [place], [place] [address] Cell Phone (Monday-Friday 8 am - 5 pm): [phone] On Call: [phone] and follow prompts after 5 pm and on weekends Office Phone: [phone] Office Fax: [phone]

## 2016-04-06 DIAGNOSIS — D692 Other nonthrombocytopenic purpura: Secondary | ICD-10-CM | POA: Insufficient documentation

## 2016-04-15 ENCOUNTER — Other Ambulatory Visit: Payer: Self-pay

## 2016-04-15 MED ORDER — LORAZEPAM 0.5 MG PO TABS: ORAL_TABLET | ORAL | 5 refills | Status: DC

## 2016-04-15 NOTE — Telephone Encounter (Signed)
Rx faxed to [facility] @ [phone], phone number [phone]  

## 2016-04-16 ENCOUNTER — Other Ambulatory Visit: Payer: Self-pay

## 2016-04-16 MED ORDER — LORAZEPAM 0.5 MG PO TABS: ORAL_TABLET | ORAL | 0 refills | Status: AC

## 2016-04-16 NOTE — Telephone Encounter (Signed)
[facility]

## 2016-04-29 LAB — BASIC METABOLIC PANEL
BUN: 20 mg/dL (ref 4–21)
CREATININE: 0.8 mg/dL (ref 0.5–1.1)
GLUCOSE: 115 mg/dL
Potassium: 4.1 mmol/L (ref 3.4–5.3)
SODIUM: 143 mmol/L (ref 137–147)

## 2016-04-29 LAB — CBC AND DIFFERENTIAL
HEMATOCRIT: 34 % — AB (ref 36–46)
Hemoglobin: 11 g/dL — AB (ref 12.0–16.0)
Platelets: 234 10*3/uL (ref 150–399)
WBC: 6 10^3/mL

## 2016-05-02 ENCOUNTER — Encounter: Payer: Self-pay | Admitting: Internal Medicine

## 2016-05-02 ENCOUNTER — Non-Acute Institutional Stay (SKILLED_NURSING_FACILITY): Payer: Medicare Other | Admitting: Internal Medicine

## 2016-05-02 DIAGNOSIS — E43 Unspecified severe protein-calorie malnutrition: Secondary | ICD-10-CM

## 2016-05-02 DIAGNOSIS — F0150 Vascular dementia without behavioral disturbance: Secondary | ICD-10-CM | POA: Insufficient documentation

## 2016-05-02 DIAGNOSIS — L89610 Pressure ulcer of right heel, unstageable: Secondary | ICD-10-CM | POA: Diagnosis not present

## 2016-05-02 DIAGNOSIS — F0151 Vascular dementia with behavioral disturbance: Secondary | ICD-10-CM | POA: Diagnosis not present

## 2016-05-02 DIAGNOSIS — R627 Adult failure to thrive: Secondary | ICD-10-CM | POA: Diagnosis not present

## 2016-05-02 DIAGNOSIS — L03115 Cellulitis of right lower limb: Secondary | ICD-10-CM | POA: Diagnosis not present

## 2016-05-02 DIAGNOSIS — L97519 Non-pressure chronic ulcer of other part of right foot with unspecified severity: Secondary | ICD-10-CM

## 2016-05-02 DIAGNOSIS — F01518 Vascular dementia, unspecified severity, with other behavioral disturbance: Secondary | ICD-10-CM

## 2016-05-02 NOTE — Progress Notes (Signed)
Patient ID: Morgan [name], female   DOB: 13-Nov-1926, 82 y.o.   MRN: [id number]     Location:    Nursing Home Room Number: [id number] Place of Service:  SNF (31)   Blanchie Serve, MD  Patient Care Team: Blanchie Serve, MD as PCP - General (Internal Medicine)  Extended Emergency Contact Information Primary Emergency Contact: Collins,Linda Address: [address]          [place] [address] Johnnette Litter of [place] Phone: [phone] Mobile Phone: [phone] Relation: Daughter  Code Status:  DNR  Goals of care: Advanced Directive information Advanced Directives 02/01/2016  Does patient have an advance directive? Yes  Type of Advance Directive Out of facility DNR (pink MOST or yellow form)  Does patient want to make changes to advanced directive? No - Patient declined  Copy of advanced directive(s) in chart? Yes     Chief Complaint  Patient presents with  . Medical Management of Chronic Issues    Routine Visit    Allergies  Allergen Reactions  . Codeine Nausea And Vomiting    HPI:  Pt is a 82 y.o. female seen today for routine visit. She has been having overall decline. She has poor oral intake per nursing. She is under total care. She has new wound to her right foot and is now on antibiotics for cellulitis. She is being treated by wound care team. She has advanced dementia and does not participate in HPI and ROS.    ROS Unable to obtain from patient   Past Medical History:  Diagnosis Date  . Abnormality of gait   . Aftercare for healing traumatic fracture of hip   . Anemia   . Anxiety state, unspecified   . Arthritis   . Cancer (HCC)    HX OF BREAST CANCER  . Chronic kidney disease   . Dementia   . Depression   . Fracture of neck of femur ([place])   . Hypertension   . Long term (current) use of anticoagulants   . Macular degeneration   . Osteoporosis, unspecified   . Peripheral vascular complications   . Personal history of fall   . Unspecified  constipation    Past Surgical History:  Procedure Laterality Date  . ABDOMINAL HYSTERECTOMY    . FRACTURE SURGERY Left 07/2012   hip  . HIP ARTHROPLASTY  08/01/2012   Procedure: ARTHROPLASTY BIPOLAR HIP;  Surgeon: Nita Sells, MD;  Location: [facility];  Service: Orthopedics;  Laterality: Left;  Surgeon requests to follow 1st case.  . lumpectormy         Medication List       Accurate as of 05/02/16 12:10 PM. Always use your most recent med list.          acetaminophen 325 MG tablet Commonly known as:  TYLENOL Take 650 mg by mouth every 4 (four) hours as needed for mild pain.   calcium-vitamin D 500-200 MG-UNIT tablet Commonly known as:  OSCAL WITH D Take 1 tablet by mouth daily with breakfast.   citalopram 20 MG tablet Commonly known as:  CELEXA Take 20 mg by mouth daily. For depression/ anxiety.   cloNIDine 0.3 mg/24hr patch Commonly known as:  CATAPRES - Dosed in mg/24 hr Place 0.3 mg onto the skin once a week.   Cranberry 200 MG Caps Take 400 mg by mouth 2 (two) times daily. For UTI prevention   cyanocobalamin 500 MCG tablet Take 500 mcg by mouth daily.   D3-1000 1000 units  tablet Generic drug:  Cholecalciferol Take 2,000 Units by mouth daily.   doxycycline 100 MG tablet Commonly known as:  VIBRA-TABS Take 100 mg by mouth 2 (two) times daily. Stop date 05/02/16   lisinopril 30 MG tablet Commonly known as:  PRINIVIL,ZESTRIL Take 30 mg by mouth daily.   loratadine 10 MG tablet Commonly known as:  CLARITIN Take 10 mg by mouth daily.   LORazepam 0.5 MG tablet Commonly known as:  ATIVAN Take 1/2 tablet by mouth twice daily for anxiety; Take 1/2 tablet by mouth every 6 hours as needed for anxiety   NUEDEXTA 20-10 MG Caps Generic drug:  Dextromethorphan-Quinidine Take 1 capsule by mouth 2 (two) times daily.   PRESERVISION AREDS PO Take 1 capsule by mouth 2 (two) times daily.   PROBIOTIC DAILY PO Take 1 tablet by mouth daily.   rivastigmine  9.5 mg/24hr Commonly known as:  EXELON Place 9.5 mg onto the skin daily. Rotate sites, for dementia   sennosides-docusate sodium 8.6-50 MG tablet Commonly known as:  SENOKOT-S Take 2 tablets by mouth 2 (two) times daily.   UNABLE TO FIND Med Name: Magic cup by mouth daily   UNABLE TO FIND Med Name: Med Pass 60 mL 3 times a day due to low albumin        Immunization History  Administered Date(s) Administered  . Influenza-Unspecified 06/02/2013, 06/15/2014  . PPD Test 05/21/2013, 05/23/2014, 05/16/2015  . Pneumococcal-Unspecified 06/02/2014   Pertinent  Health Maintenance Due  Topic Date Due  . INFLUENZA VACCINE  09/09/2016 (Originally 04/09/2016)  . PNA vac Low Risk Adult (2 of 2 - PCV13) 09/09/2016 (Originally 06/03/2015)  . DEXA SCAN  Completed    Physical exam: Vitals:   05/02/16 1201  BP: (!) 109/45  Pulse: 86  Resp: 19  Temp: 97.8 F (36.6 C)  TempSrc: Oral  SpO2: 95%  Weight: 110 lb 4.8 oz (50 kg)  Height: 5\' 5"  (1.651 m)   Body mass index is 18.35 kg/m.  Constitutional: elderly frail thin built female in no acute distress.  Head: Normocephalic. Atraumatic.  Mouth/Throat: MMM Neck: Neck supple.  Cardiovascular: Normal s1,s2, murmur +, diminished distal pulses Pulmonary/Chest: Effort normal and breath sounds normal.   Abdominal: Soft. Bowel sounds +. Non distended.  Musculoskeletal: Able to move all 4 extremities, contracture to right elbow with soft splint present, moderate muscle wasting present, weak dorsalis pedis Skin: Skin is warm and dry. keratosis on face, geri sleeve present, unstageable pressure ulcer to right heel with serosanguineous drainage, maceration present, mild erythema around, has arterial ulcer to right foot top with slough and serosanguineous drainage Psychiatric: calm this visit.     Labs reviewed:  Recent Labs  11/28/15 01/03/16 02/13/16  NA 142 140 139  K 4.4 4.5 4.3  BUN 28* 17 27*  CREATININE 0.9 0.7 0.9    Recent Labs   11/28/15 01/03/16  AST 11* 9*  ALT 3* 5*  ALKPHOS 71 61    Recent Labs  06/12/15 11/28/15 02/13/16  WBC 11.2 3.3 9.2  HGB 14.3 11.3* 14.5  HCT 43 34* 46  PLT 227 187 235   Lab Results  Component Value Date   TSH 0.50 11/08/2014   Lab Results  Component Value Date   HGBA1C 5.2 11/28/2015     Assessment/Plan  Right foot arterial ulcer Get ABI to assess further. Continue wound care with treatment nurse.   Right foot cellulitis To continue doxycycline 100 mg bid until 04/01/16. Continue probiotic. Improvement has been  made since antibiotic on board  Right heel pressure ulcer Continue wound dressing and heel floaters. Gel overlay mattress. pressure ulcer prophylaxis. Continue tylenol 650 mg q6h prn pain  Failure to thrive Poor po intake and has been losing weight. She now has pressure ulcer and arterial ulcer and has poor overall prognosis with her advanced dementia and medical co-morbidities. Get palliative care consult  Severe Protein calorie malnutrition With decline anticipated. Get palliative care consult. Assistance with feeding. Continue medpass and magic cup  Advanced dementia Mix of senile and vascular dementia. Decline anticipated. Continue supportive care with her ADLs. fall precautions and pressure ulcer prophylaxis.     Blanchie Serve, MD Internal Medicine [facility] [address] [place], [place] [address] Cell Phone (Monday-Friday 8 am - 5 pm): [phone] On Call: [phone] and follow prompts after 5 pm and on weekends Office Phone: [phone] Office Fax: [phone]

## 2016-06-05 ENCOUNTER — Encounter: Payer: Self-pay | Admitting: Internal Medicine

## 2016-06-05 ENCOUNTER — Non-Acute Institutional Stay (SKILLED_NURSING_FACILITY): Payer: Medicare Other | Admitting: Internal Medicine

## 2016-06-05 DIAGNOSIS — I10 Essential (primary) hypertension: Secondary | ICD-10-CM | POA: Diagnosis not present

## 2016-06-05 DIAGNOSIS — F482 Pseudobulbar affect: Secondary | ICD-10-CM

## 2016-06-05 DIAGNOSIS — I771 Stricture of artery: Secondary | ICD-10-CM

## 2016-06-05 DIAGNOSIS — L8995 Pressure ulcer of unspecified site, unstageable: Secondary | ICD-10-CM

## 2016-06-05 DIAGNOSIS — L98499 Non-pressure chronic ulcer of skin of other sites with unspecified severity: Secondary | ICD-10-CM

## 2016-06-05 NOTE — Progress Notes (Signed)
Patient ID: Morgan [name], female   DOB: 1927-04-12, 82 y.o.   MRN: [id number]     Location:    Nursing Home Room Number: [id number] Place of Service:  SNF (31)   Blanchie Serve, MD  Patient Care Team: Blanchie Serve, MD as PCP - General (Internal Medicine)  Extended Emergency Contact Information Primary Emergency Contact: Collins,Linda Address: [address]          [place] [address] Johnnette Litter of [place] Phone: [phone] Mobile Phone: [phone] Relation: Daughter  Code Status:  DNR  Goals of care: Advanced Directive information Advanced Directives 02/01/2016  Does patient have an advance directive? Yes  Type of Advance Directive Out of facility DNR (pink MOST or yellow form)  Does patient want to make changes to advanced directive? No - Patient declined  Copy of advanced directive(s) in chart? Yes     Chief Complaint  Patient presents with  . Medical Management of Chronic Issues    Routine Visit    Allergies  Allergen Reactions  . Codeine Nausea And Vomiting    HPI:  Pt is a 82 y.o. female seen today for routine visit. She continues to decline, is mostly in bed and has poor po intake. She has several arterial ulcers to her feet. Her dementia is advanced and she does not participate in HPI and ROS. Her daughter does not want her to be under hospice services. She has been seen by palliative care team. She is under total care.   ROS Unable to obtain from patient   Past Medical History:  Diagnosis Date  . Abnormality of gait   . Aftercare for healing traumatic fracture of hip   . Anemia   . Anxiety state, unspecified   . Arthritis   . Cancer (HCC)    HX OF BREAST CANCER  . Chronic kidney disease   . Dementia   . Depression   . Fracture of neck of femur ([place])   . Hypertension   . Long term (current) use of anticoagulants   . Macular degeneration   . Osteoporosis, unspecified   . Peripheral vascular complications   . Personal history  of fall   . Unspecified constipation    Past Surgical History:  Procedure Laterality Date  . ABDOMINAL HYSTERECTOMY    . FRACTURE SURGERY Left 07/2012   hip  . HIP ARTHROPLASTY  08/01/2012   Procedure: ARTHROPLASTY BIPOLAR HIP;  Surgeon: Nita Sells, MD;  Location: [facility];  Service: Orthopedics;  Laterality: Left;  Surgeon requests to follow 1st case.  . lumpectormy         Medication List       Accurate as of 06/05/16 12:02 PM. Always use your most recent med list.          acetaminophen 325 MG tablet Commonly known as:  TYLENOL Take 650 mg by mouth every 4 (four) hours as needed for mild pain. Give 650 mg by mouth 2 times daily for pain   calcium-vitamin D 500-200 MG-UNIT tablet Commonly known as:  OSCAL WITH D Take 1 tablet by mouth daily with breakfast.   citalopram 20 MG tablet Commonly known as:  CELEXA Take 20 mg by mouth daily. For depression/ anxiety.   cloNIDine 0.3 mg/24hr patch Commonly known as:  CATAPRES - Dosed in mg/24 hr Place 0.3 mg onto the skin once a week.   Cranberry 200 MG Caps Take 400 mg by mouth 2 (two) times daily. For UTI prevention   cyanocobalamin  500 MCG tablet Take 500 mcg by mouth daily.   D3-1000 1000 units tablet Generic drug:  Cholecalciferol Take 2,000 Units by mouth daily.   feeding supplement (PRO-STAT SUGAR FREE 64) Liqd Take 30 mLs by mouth 2 (two) times daily.   lisinopril 30 MG tablet Commonly known as:  PRINIVIL,ZESTRIL Take 30 mg by mouth daily.   loratadine 10 MG tablet Commonly known as:  CLARITIN Take 10 mg by mouth daily.   LORazepam 0.5 MG tablet Commonly known as:  ATIVAN Take 1/2 tablet by mouth twice daily for anxiety; Take 1/2 tablet by mouth every 6 hours as needed for anxiety   morphine 20 MG/5ML solution Take by mouth every 8 (eight) hours as needed for pain. Give 0.25 mL by mouth   NUEDEXTA 20-10 MG Caps Generic drug:  Dextromethorphan-Quinidine Take 1 capsule by mouth 2 (two)  times daily.   PRESERVISION AREDS PO Take 1 capsule by mouth 2 (two) times daily.   rivastigmine 9.5 mg/24hr Commonly known as:  EXELON Place 9.5 mg onto the skin daily. Rotate sites, for dementia   sennosides-docusate sodium 8.6-50 MG tablet Commonly known as:  SENOKOT-S Take 2 tablets by mouth 2 (two) times daily.   UNABLE TO FIND Med Name: Magic cup by mouth daily   UNABLE TO FIND Med Name: Med Pass 120 mL 3 times a day due to low albumin        Immunization History  Administered Date(s) Administered  . Influenza-Unspecified 06/02/2013, 06/15/2014  . PPD Test 05/21/2013, 05/23/2014, 05/16/2015  . Pneumococcal-Unspecified 06/02/2014   Pertinent  Health Maintenance Due  Topic Date Due  . INFLUENZA VACCINE  09/09/2016 (Originally 04/09/2016)  . PNA vac Low Risk Adult (2 of 2 - PCV13) 09/09/2016 (Originally 06/03/2015)  . DEXA SCAN  Completed    Physical exam: Vitals:   06/05/16 1153  BP: (!) 148/65  Pulse: 60  Resp: 18  Temp: (!) 96.9 F (36.1 C)  TempSrc: Oral  SpO2: 98%  Weight: 104 lb 6.4 oz (47.4 kg)  Height: 5\' 5"  (1.651 m)   Body mass index is 17.37 kg/m.  Constitutional: elderly frail and thin built female in no acute distress  Head: Normocephalic. Atraumatic. Temporal muscle wasting.  Mouth/Throat: MMM Neck: Neck supple.  Cardiovascular: Normal s1,s2, murmur + Pulmonary: CTAB  Abdomen: Soft. Bowel sounds +, non tender Musculoskeletal: Able to move all 4 extremities, contracture to right elbow and right wrist  Skin: warm and dry. keratosis on face, SDTI arterial ulcer to right 5th and 1st metatarsal head and right anterior ankle. Left great toe arterial ulcer. unstageable pressure ulcer to coccyx   Labs reviewed:  Recent Labs  01/03/16 02/13/16 04/29/16  NA 140 139 143  K 4.5 4.3 4.1  BUN 17 27* 20  CREATININE 0.7 0.9 0.8    Recent Labs  11/28/15 01/03/16  AST 11* 9*  ALT 3* 5*  ALKPHOS 71 61    Recent Labs  11/28/15 02/13/16  04/29/16  WBC 3.3 9.2 6.0  HGB 11.3* 14.5 11.0*  HCT 34* 46 34*  PLT 187 235 234   Lab Results  Component Value Date   TSH 0.50 11/08/2014   Lab Results  Component Value Date   HGBA1C 5.2 11/28/2015     Assessment/Plan  Arterial ulcer Multiple ulcer to right and left foot. ABI reviewed with mild to moderate arterial insufficiency. Continue prn morphine for pain  unstageable pressure ulcer to coccyx Continue pressure ulcer treatment and prophylaxis. She is a high  risk for skin breakdown and ulcers. Continue morphine solution on need basis for pain. Continue decubivite and protein supplement  HTN Continue her lisinopril with clonidine patch for now and monitor  PBA Currently on neudexta and ativan. NP discussed with daughter about coming off neudexta with concern of this contributing to her sleepiness but daughter does not want it changed at present.   She has overall poor prognosis and is declining. Monitor clinically for now. Will need to revisit goals of care with her daughter.    Blanchie Serve, MD Internal Medicine [facility] [address] [place], [place] [address] Cell Phone (Monday-Friday 8 am - 5 pm): [phone] On Call: [phone] and follow prompts after 5 pm and on weekends Office Phone: [phone] Office Fax: [phone]

## 2016-07-10 DEATH — deceased

## 2021-02-14 ENCOUNTER — Other Ambulatory Visit (HOSPITAL_COMMUNITY): Payer: Self-pay
